# Patient Record
Sex: Female | Born: 2000
Health system: Southern US, Community
[De-identification: ages and names within clinical notes are randomized; demographics above are authoritative.]

## PROBLEM LIST (undated history)

## (undated) ENCOUNTER — Inpatient Hospital Stay (HOSPITAL_COMMUNITY): Payer: Self-pay

## (undated) DIAGNOSIS — J45909 Unspecified asthma, uncomplicated: Secondary | ICD-10-CM

## (undated) HISTORY — PX: TONSILLECTOMY: SUR1361

## (undated) HISTORY — PX: WISDOM TOOTH EXTRACTION: SHX21

## (undated) HISTORY — DX: Unspecified asthma, uncomplicated: J45.909

## (undated) HISTORY — PX: OOPHORECTOMY: SHX86

## (undated) NOTE — Telephone Encounter (Signed)
 Formatting of this note might be different from the original. Message forwarded Electronically signed by Leotis Hendricks Conrad, Patient Care Technician at 06/10/2019  7:44 AM EST

## (undated) NOTE — Telephone Encounter (Signed)
 Formatting of this note might be different from the original. See message Electronically signed by Rosina Earnie Birmingham, LPN at 96/98/7978 10:12 AM EST

## (undated) NOTE — Telephone Encounter (Signed)
 Formatting of this note might be different from the original. Message forwarded Electronically signed by Leotis Hendricks Conrad, Patient Care Technician at 06/10/2019  9:17 AM EST

---
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
## 2018-01-14 ENCOUNTER — Emergency Department (HOSPITAL_COMMUNITY): Payer: Medicaid Other

## 2018-01-14 ENCOUNTER — Encounter (HOSPITAL_COMMUNITY): Payer: Self-pay | Admitting: *Deleted

## 2018-01-14 ENCOUNTER — Other Ambulatory Visit: Payer: Self-pay

## 2018-01-14 ENCOUNTER — Emergency Department (HOSPITAL_COMMUNITY)
Admission: EM | Admit: 2018-01-14 | Discharge: 2018-01-14 | Disposition: A | Discharge status: Home / Self Care | Payer: Medicaid Other | Attending: Emergency Medicine | Admitting: Emergency Medicine

## 2018-01-14 DIAGNOSIS — S60222A Contusion of left hand, initial encounter: Secondary | ICD-10-CM | POA: Diagnosis not present

## 2018-01-14 DIAGNOSIS — S8012XA Contusion of left lower leg, initial encounter: Secondary | ICD-10-CM | POA: Diagnosis not present

## 2018-01-14 DIAGNOSIS — S0081XA Abrasion of other part of head, initial encounter: Secondary | ICD-10-CM | POA: Diagnosis not present

## 2018-01-14 DIAGNOSIS — Y939 Activity, unspecified: Secondary | ICD-10-CM | POA: Diagnosis not present

## 2018-01-14 DIAGNOSIS — Z3491 Encounter for supervision of normal pregnancy, unspecified, first trimester: Secondary | ICD-10-CM | POA: Diagnosis not present

## 2018-01-14 DIAGNOSIS — Z3A01 Less than 8 weeks gestation of pregnancy: Secondary | ICD-10-CM | POA: Diagnosis not present

## 2018-01-14 DIAGNOSIS — O418X10 Other specified disorders of amniotic fluid and membranes, first trimester, not applicable or unspecified: Secondary | ICD-10-CM

## 2018-01-14 DIAGNOSIS — O468X1 Other antepartum hemorrhage, first trimester: Secondary | ICD-10-CM | POA: Diagnosis not present

## 2018-01-14 DIAGNOSIS — S0990XA Unspecified injury of head, initial encounter: Secondary | ICD-10-CM | POA: Diagnosis present

## 2018-01-14 DIAGNOSIS — S0083XA Contusion of other part of head, initial encounter: Secondary | ICD-10-CM | POA: Diagnosis not present

## 2018-01-14 DIAGNOSIS — Y929 Unspecified place or not applicable: Secondary | ICD-10-CM | POA: Diagnosis not present

## 2018-01-14 DIAGNOSIS — R102 Pelvic and perineal pain: Secondary | ICD-10-CM

## 2018-01-14 DIAGNOSIS — Y999 Unspecified external cause status: Secondary | ICD-10-CM | POA: Insufficient documentation

## 2018-01-14 LAB — ABO/RH: ABO/RH(D): A POS

## 2018-01-14 LAB — HCG, QUANTITATIVE, PREGNANCY: HCG, BETA CHAIN, QUANT, S: 82030 m[IU]/mL — AB (ref <5)

## 2018-01-14 NOTE — Discharge Instructions (Signed)
Return if you are having any problem - especially if you develop vaginal bleeding.  Apply ice as needed.  Take acetaminophen for pain. Do not take ibuprofen or naproxen.
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
## 2018-01-14 NOTE — ED Notes (Signed)
Patient given discharge instructions and verbalized understanding.  Patient stable to discharge at this time.  Patient is alert and oriented to baseline.  No distressed noted at this time.  All belongings taken with the patient at discharge.   

## 2018-01-15 ENCOUNTER — Telehealth: Payer: Self-pay | Admitting: *Deleted
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
## 2018-01-25 ENCOUNTER — Telehealth: Payer: Self-pay | Admitting: Family Medicine

## 2018-01-25 DIAGNOSIS — O219 Vomiting of pregnancy, unspecified: Secondary | ICD-10-CM

## 2018-01-25 MED ORDER — PROMETHAZINE HCL 25 MG PO TABS: 25.0000 mg | ORAL_TABLET | Freq: Four times a day (QID) | ORAL | 1 refills | Status: DC | PRN

## 2018-01-25 NOTE — Telephone Encounter (Signed)
Pt called stated that she needs a prescription for nausea. Send to the [facility] on [address].

## 2018-01-25 NOTE — Telephone Encounter (Signed)
Pt called stated that she needs a prescription for nausea. Send to the [facility] on [address].  

## 2018-01-25 NOTE — Addendum Note (Signed)
Addended by: Gerome Apley on: 01/25/2018 04:34 PM   Modules accepted: Orders

## 2018-02-06 ENCOUNTER — Other Ambulatory Visit (HOSPITAL_COMMUNITY)
Admission: RE | Admit: 2018-02-06 | Discharge: 2018-02-06 | Disposition: A | Discharge status: Home / Self Care | Payer: Medicaid Other | Source: Ambulatory Visit | Attending: Student | Admitting: Student

## 2018-02-06 ENCOUNTER — Encounter: Payer: Self-pay | Admitting: Student

## 2018-02-06 ENCOUNTER — Ambulatory Visit (INDEPENDENT_AMBULATORY_CARE_PROVIDER_SITE_OTHER): Payer: Medicaid Other | Admitting: Student

## 2018-02-06 DIAGNOSIS — Z3480 Encounter for supervision of other normal pregnancy, unspecified trimester: Secondary | ICD-10-CM

## 2018-02-06 DIAGNOSIS — Z3481 Encounter for supervision of other normal pregnancy, first trimester: Secondary | ICD-10-CM | POA: Insufficient documentation

## 2018-02-06 DIAGNOSIS — Z23 Encounter for immunization: Secondary | ICD-10-CM | POA: Diagnosis not present

## 2018-02-06 DIAGNOSIS — Z3A10 10 weeks gestation of pregnancy: Secondary | ICD-10-CM | POA: Diagnosis not present

## 2018-02-06 LAB — POCT URINALYSIS DIP (DEVICE)
BILIRUBIN URINE: NEGATIVE
Glucose, UA: NEGATIVE mg/dL
HGB URINE DIPSTICK: NEGATIVE
LEUKOCYTES UA: NEGATIVE
Nitrite: NEGATIVE
Protein, ur: 30 mg/dL — AB
Urobilinogen, UA: 0.2 mg/dL (ref 0.0–1.0)
pH: 6.5 (ref 5.0–8.0)

## 2018-02-06 NOTE — Progress Notes (Signed)
  Subjective:    Christy Hooper is being seen today for her first obstetrical visit.  This is not a planned pregnancy. She is at [redacted]w[redacted]d gestation. Her obstetrical history is significant for . Relationship with FOB: not together. . Patient does intend to breast feed. Pregnancy history fully reviewed.  Patient reports no complaints other than her breasts are enlarged and starting to hurt.   Review of Systems:   Review of Systems  Constitutional: Negative.   HENT: Negative.   Respiratory: Negative.   Gastrointestinal: Negative.   Genitourinary: Negative.   Musculoskeletal: Negative.   Neurological: Negative.     Objective:     BP 116/71   Pulse 89   Wt 132 lb 12.8 oz (60.2 kg)   LMP 11/24/2017 (Approximate) Comment: pt shielded  BMI 24.29 kg/m  Physical Exam  Constitutional: She appears well-developed.  HENT:  Head: Normocephalic.  Neck: Normal range of motion.  Respiratory: Effort normal.  GI: Soft.  Musculoskeletal: Normal range of motion.  Neurological: She is alert.  Skin: Skin is warm and dry.    Exam    Assessment:    Pregnancy: G1P0 Patient Active Problem List   Diagnosis Date Noted  . Supervision of other normal pregnancy, antepartum 02/06/2018       Plan:     Initial labs drawn. Prenatal vitamins. Problem list reviewed and updated. AFP3 discussed: will do NIPS; needs AFP. Role of ultrasound in pregnancy discussed; fetal survey: ordered. Amniocentesis discussed: not indicated. Follow up in 4 weeks. 50% of 30 min visit spent on counseling and coordination of care.  -Discussed breastfeeding -Discussed contraception; does not want nexplanon or IUD -Welcomed patient to practice and oriented her to providers, students, residents.   Charlesetta Garibaldi Kooistra 02/06/2018

## 2018-02-06 NOTE — Patient Instructions (Signed)
Eating Plan for Pregnant Women While you are pregnant, your body will require additional nutrition to help support your growing baby. It is recommended that you consume:  150 additional calories each day during your first trimester.  300 additional calories each day during your second trimester.  300 additional calories each day during your third trimester.  Eating a healthy, well-balanced diet is very important for your health and for your baby's health. You also have a higher need for some vitamins and minerals, such as folic acid, calcium, iron, and vitamin D. What do I need to know about eating during pregnancy?  Do not try to lose weight or go on a diet during pregnancy.  Choose healthy, nutritious foods. Choose  of a sandwich with a glass of milk instead of a candy bar or a high-calorie sugar-sweetened beverage.  Limit your overall intake of foods that have "empty calories." These are foods that have little nutritional value, such as sweets, desserts, candies, sugar-sweetened beverages, and fried foods.  Eat a variety of foods, especially fruits and vegetables.  Take a prenatal vitamin to help meet the additional needs during pregnancy, specifically for folic acid, iron, calcium, and vitamin D.  Remember to stay active. Ask your health care provider for exercise recommendations that are specific to you.  Practice good food safety and cleanliness, such as washing your hands before you eat and after you prepare raw meat. This helps to prevent foodborne illnesses, such as listeriosis, that can be very dangerous for your baby. Ask your health care provider for more information about listeriosis. What does 150 extra calories look like? Healthy options for an additional 150 calories each day could be any of the following:  Plain low-fat yogurt (6-8 oz) with  cup of berries.  1 apple with 2 teaspoons of peanut butter.  Cut-up vegetables with  cup of hummus.  Low-fat chocolate  milk (8 oz or 1 cup).  1 string cheese with 1 medium orange.   of a peanut butter and jelly sandwich on whole-wheat bread (1 tsp of peanut butter).  For 300 calories, you could eat two of those healthy options each day. What is a healthy amount of weight to gain? The recommended amount of weight for you to gain is based on your pre-pregnancy BMI. If your pre-pregnancy BMI was:  Less than 18 (underweight), you should gain 28-40 lb.  18-24.9 (normal), you should gain 25-35 lb.  25-29.9 (overweight), you should gain 15-25 lb.  Greater than 30 (obese), you should gain 11-20 lb.  What if I am having twins or multiples? Generally, pregnant women who will be having twins or multiples may need to increase their daily calories by 300-600 calories each day. The recommended range for total weight gain is 25-54 lb, depending on your pre-pregnancy BMI. Talk with your health care provider for specific guidance about additional nutritional needs, weight gain, and exercise during your pregnancy. What foods can I eat? Grains Any grains. Try to choose whole grains, such as whole-wheat bread, oatmeal, or brown rice. Vegetables Any vegetables. Try to eat a variety of colors and types of vegetables to get a full range of vitamins and minerals. Remember to wash your vegetables well before eating. Fruits Any fruits. Try to eat a variety of colors and types of fruit to get a full range of vitamins and minerals. Remember to wash your fruits well before eating. Meats and Other Protein Sources Lean meats, including chicken, turkey, fish, and lean cuts of beef, veal,   or pork. Make sure that all meats are cooked to "well done." Tofu. Tempeh. Beans. Eggs. Peanut butter and other nut butters. Seafood, such as shrimp, crab, and lobster. If you choose fish, select types that are higher in omega-3 fatty acids, including salmon, herring, mussels, trout, sardines, and pollock. Make sure that all meats are cooked to  food-safe temperatures. Dairy Pasteurized milk and milk alternatives. Pasteurized yogurt and pasteurized cheese. Cottage cheese. Sour cream. Beverages Water. Juices that contain 100% fruit juice or vegetable juice. Caffeine-free teas and decaffeinated coffee. Drinks that contain caffeine are okay to drink, but it is better to avoid caffeine. Keep your total caffeine intake to less than 200 mg each day (12 oz of coffee, tea, or soda) or as directed by your health care provider. Condiments Any pasteurized condiments. Sweets and Desserts Any sweets and desserts. Fats and Oils Any fats and oils. The items listed above may not be a complete list of recommended foods or beverages. Contact your dietitian for more options. What foods are not recommended? Vegetables Unpasteurized (raw) vegetable juices. Fruits Unpasteurized (raw) fruit juices. Meats and Other Protein Sources Cured meats that have nitrates, such as bacon, salami, and hotdogs. Luncheon meats, bologna, or other deli meats (unless they are reheated until they are steaming hot). Refrigerated pate, meat spreads from a meat counter, smoked seafood that is found in the refrigerated section of a store. Raw fish, such as sushi or sashimi. High mercury content fish, such as tilefish, shark, swordfish, and king mackerel. Raw meats, such as tuna or beef tartare. Undercooked meats and poultry. Make sure that all meats are cooked to food-safe temperatures. Dairy Unpasteurized (raw) milk and any foods that have raw milk in them. Soft cheeses, such as feta, queso blanco, queso fresco, Brie, Camembert cheeses, blue-veined cheeses, and Panela cheese (unless it is made with pasteurized milk, which must be stated on the label). Beverages Alcohol. Sugar-sweetened beverages, such as sodas, teas, or energy drinks. Condiments Homemade fermented foods and drinks, such as pickles, sauerkraut, or kombucha drinks. (Store-bought pasteurized versions of these are  okay.) Other Salads that are made in the store, such as ham salad, chicken salad, egg salad, tuna salad, and seafood salad. The items listed above may not be a complete list of foods and beverages to avoid. Contact your dietitian for more information. This information is not intended to replace advice given to you by your health care provider. Make sure you discuss any questions you have with your health care provider. Document Released: 01/10/2014 Document Revised: 09/03/2015 Document Reviewed: 09/10/2013 Elsevier Interactive Patient Education  2018 Elsevier Inc.   

## 2018-02-06 NOTE — Progress Notes (Signed)
New OB packets not available/  Medicaid Home Form Completed 

## 2018-02-07 LAB — CERVICOVAGINAL ANCILLARY ONLY
CHLAMYDIA, DNA PROBE: NEGATIVE
Neisseria Gonorrhea: NEGATIVE

## 2018-02-09 LAB — URINE CULTURE, OB REFLEX

## 2018-02-09 LAB — CULTURE, OB URINE

## 2018-02-13 ENCOUNTER — Telehealth: Payer: Self-pay | Admitting: General Practice

## 2018-02-13 NOTE — Telephone Encounter (Signed)
Patient called and left message on nurse voicemail line requesting genetic test results. Called patient & explained results are not back yet but will show up in mychart whenever we get them back which should be another week or so. Patient verbalized understanding & had no questions.

## 2018-02-14 LAB — OBSTETRIC PANEL, INCLUDING HIV
Antibody Screen: NEGATIVE
BASOS ABS: 0 10*3/uL (ref 0.0–0.2)
Basos: 0 %
EOS (ABSOLUTE): 0 10*3/uL (ref 0.0–0.4)
Eos: 0 %
HEP B S AG: NEGATIVE
HIV SCREEN 4TH GENERATION: NONREACTIVE
Hematocrit: 33.8 % — ABNORMAL LOW (ref 34.0–46.6)
Hemoglobin: 11.4 g/dL (ref 11.1–15.9)
Immature Grans (Abs): 0 10*3/uL (ref 0.0–0.1)
Immature Granulocytes: 0 %
LYMPHS ABS: 1.2 10*3/uL (ref 0.7–3.1)
Lymphs: 11 %
MCH: 29.5 pg (ref 26.6–33.0)
MCHC: 33.7 g/dL (ref 31.5–35.7)
MCV: 88 fL (ref 79–97)
MONOS ABS: 1 10*3/uL — AB (ref 0.1–0.9)
Monocytes: 9 %
NEUTROS ABS: 8.7 10*3/uL — AB (ref 1.4–7.0)
Neutrophils: 80 %
PLATELETS: 271 10*3/uL (ref 150–450)
RBC: 3.86 x10E6/uL (ref 3.77–5.28)
RDW: 14.6 % (ref 12.3–15.4)
RH TYPE: POSITIVE
RPR: NONREACTIVE
Rubella Antibodies, IGG: 6.6 index (ref >0.99)
WBC: 11 10*3/uL — AB (ref 3.4–10.8)

## 2018-02-14 LAB — SMN1 COPY NUMBER ANALYSIS (SMA CARRIER SCREENING)

## 2018-02-14 LAB — HEMOGLOBINOPATHY EVALUATION
Ferritin: 24 ng/mL (ref 15–77)
HGB A2 QUANT: 2.3 % (ref 1.8–3.2)
HGB A: 97.7 % (ref 96.4–98.8)
HGB C: 0 %
HGB F QUANT: 0 % (ref 0.0–2.0)
HGB S: 0 %
Hgb Solubility: NEGATIVE
Hgb Variant: 0 %

## 2018-02-14 LAB — HEMOGLOBIN A1C
Est. average glucose Bld gHb Est-mCnc: 111 mg/dL
Hgb A1c MFr Bld: 5.5 % (ref 4.8–5.6)

## 2018-02-14 LAB — CYSTIC FIBROSIS GENE TEST

## 2018-02-16 ENCOUNTER — Inpatient Hospital Stay (HOSPITAL_COMMUNITY)
Admission: AD | Admit: 2018-02-16 | Discharge: 2018-02-17 | Disposition: A | Discharge status: Home / Self Care | Payer: Medicaid Other | Source: Ambulatory Visit | Attending: Obstetrics and Gynecology | Admitting: Obstetrics and Gynecology

## 2018-02-16 ENCOUNTER — Encounter (HOSPITAL_COMMUNITY): Payer: Self-pay

## 2018-02-16 DIAGNOSIS — O210 Mild hyperemesis gravidarum: Secondary | ICD-10-CM | POA: Insufficient documentation

## 2018-02-16 DIAGNOSIS — Z3480 Encounter for supervision of other normal pregnancy, unspecified trimester: Secondary | ICD-10-CM

## 2018-02-16 DIAGNOSIS — Z87891 Personal history of nicotine dependence: Secondary | ICD-10-CM | POA: Diagnosis not present

## 2018-02-16 DIAGNOSIS — E860 Dehydration: Secondary | ICD-10-CM | POA: Diagnosis not present

## 2018-02-16 DIAGNOSIS — O219 Vomiting of pregnancy, unspecified: Secondary | ICD-10-CM | POA: Diagnosis not present

## 2018-02-16 DIAGNOSIS — Z3A12 12 weeks gestation of pregnancy: Secondary | ICD-10-CM | POA: Diagnosis not present

## 2018-02-16 MED ORDER — SODIUM CHLORIDE 0.9 % IV SOLN
INTRAVENOUS | Status: DC
  Administered 2018-02-16: 23:00:00 via INTRAVENOUS

## 2018-02-16 MED ORDER — ONDANSETRON 4 MG PO TBDP: 4.0000 mg | ORAL_TABLET | Freq: Once | ORAL | Status: DC

## 2018-02-16 MED ORDER — ONDANSETRON HCL 4 MG/2ML IJ SOLN
4.0000 mg | Freq: Once | INTRAMUSCULAR | Status: AC
  Administered 2018-02-16: 4 mg via INTRAVENOUS
  Filled 2018-02-16: qty 2

## 2018-02-16 NOTE — [facility] Provider Note (Signed)
Chief Complaint: Abdominal Pain and Emesis   First Provider Initiated Contact with Patient 02/16/18 2301        SUBJECTIVE HPI: Christy Hooper is a 17 y.o. G1P0 at [redacted]w[redacted]d by LMP who presents to maternity admissions reporting recurrent and frequent vomiting.  States phenergan is not helping.  . She denies vaginal bleeding, vaginal itching/burning, urinary symptoms, h/a, dizziness, or fever/chills.     Emesis   This is a recurrent problem. The emesis has an appearance of stomach contents. There has been no fever. Pertinent negatives include no abdominal pain (only muscles when vomiting), chills, diarrhea, dizziness, fever, headaches or myalgias. Treatments tried: Phenergan. The treatment provided no relief.   RN Note; Vomiting all day. Phenergan not working. No diarrhea. No vag bleeding or d/c. Hurts when I throw up but otherwise no pain  Past Medical History:  Diagnosis Date  . Asthma    Past Surgical History:  Procedure Laterality Date  . TONSILLECTOMY    . WISDOM TOOTH EXTRACTION     Social History   Socioeconomic History  . Marital status: Single    Spouse name: Not on file  . Number of children: Not on file  . Years of education: Not on file  . Highest education level: Not on file  Occupational History  . Not on file  Social Needs  . Financial resource strain: Not on file  . Food insecurity:    Worry: Not on file    Inability: Not on file  . Transportation needs:    Medical: Not on file    Non-medical: Not on file  Tobacco Use  . Smoking status: Former Smoker    Types: Cigarettes, E-cigarettes  . Smokeless tobacco: Never Used  Substance and Sexual Activity  . Alcohol use: Yes  . Drug use: Yes    Types: Marijuana  . Sexual activity: Yes    Birth control/protection: None  Lifestyle  . Physical activity:    Days per week: Not on file    Minutes per session: Not on file  . Stress: Not on file  Relationships  . Social connections:    Talks on phone: Not on file     Gets together: Not on file    Attends religious service: Not on file    Active member of club or organization: Not on file    Attends meetings of clubs or organizations: Not on file    Relationship status: Not on file  . Intimate partner violence:    Fear of current or ex partner: Not on file    Emotionally abused: Not on file    Physically abused: Not on file    Forced sexual activity: Not on file  Other Topics Concern  . Not on file  Social History Narrative  . Not on file   No current facility-administered medications on file prior to encounter.    Current Outpatient Medications on File Prior to Encounter  Medication Sig Dispense Refill  . Prenatal Vit-Fe Fumarate-FA (PRENATAL COMPLETE) 14-0.4 MG TABS Take 1 tablet by mouth daily. 60 each 0  . promethazine (PHENERGAN) 25 MG tablet Take 1 tablet (25 mg total) by mouth every 6 (six) hours as needed for nausea or vomiting. 30 tablet 1   No Known Allergies  I have reviewed patient's Past Medical Hx, Surgical Hx, Family Hx, Social Hx, medications and allergies.   ROS:  Review of Systems  Constitutional: Negative for chills and fever.  Gastrointestinal: Positive for vomiting. Negative for abdominal pain (  only muscles when vomiting) and diarrhea.  Musculoskeletal: Negative for myalgias.  Neurological: Negative for dizziness and headaches.   Review of Systems  Other systems negative   Physical Exam  Physical Exam Patient Vitals for the past 24 hrs:  BP Temp Pulse Resp Height Weight  02/16/18 2158 111/66 98.1 F (36.7 C) (!) 110 18 5\' 3"  (1.6 m) 59.4 kg  Orthostatic VS: Lying  113/65   88 Sitting 114/68   89 Stand  114/71  108  Constitutional: Well-developed, well-nourished female in no acute distress.  Cardiovascular: normal rate Respiratory: normal effort GI: Abd soft, non-tender. Pos BS x 4 MS: Extremities nontender, no edema, normal ROM Neurologic: Alert and oriented x 4.  GU: Neg CVAT.  PELVIC EXAM:  deferred  FHT 160 by doppler  LAB RESULTS Results for orders placed or performed during the hospital encounter of 02/16/18 (from the past 24 hour(s))  Urinalysis, Routine w reflex microscopic     Status: Abnormal   Collection Time: 02/16/18 10:06 PM  Result Value Ref Range   Color, Urine YELLOW YELLOW   APPearance HAZY (A) CLEAR   Specific Gravity, Urine 1.020 1.005 - 1.030   pH 5.0 5.0 - 8.0   Glucose, UA NEGATIVE NEGATIVE mg/dL   Hgb urine dipstick NEGATIVE NEGATIVE   Bilirubin Urine NEGATIVE NEGATIVE   Ketones, ur 80 (A) NEGATIVE mg/dL   Protein, ur NEGATIVE NEGATIVE mg/dL   Nitrite NEGATIVE NEGATIVE   Leukocytes, UA LARGE (A) NEGATIVE   RBC / HPF 0-5 0 - 5 RBC/hpf   WBC, UA 11-20 0 - 5 WBC/hpf   Bacteria, UA RARE (A) NONE SEEN   Squamous Epithelial / LPF 0-5 0 - 5   Mucus PRESENT      A/Positive/-- (10/29 1501)  IMAGING No results found.  [facility] Management/MDM: Orthostatic VS remarkable for increased heart rate IV started and 2 liters given Zofran given with good relief Repeated Orthostatic VS with improvement noted  ASSESSMENT Single intrauterine pregnancy at [redacted]w[redacted]d Nausea and vomiting, persistent Orthostatic changes, improved after hydration  PLAN Discharge home Rx zofran to use when Phenergan does not work Advance diet as tolerated, slow steady intake  Pt stable at time of discharge. Encouraged to return here or to other Urgent Care/ED if she develops worsening of symptoms, increase in pain, fever, or other concerning symptoms.    Wynelle Bourgeois CNM, MSN Certified Nurse-Midwife 02/16/2018  11:01 PM

## 2018-02-16 NOTE — [facility] Note (Addendum)
Vomiting all day. Phenergan not working. No diarrhea. No vag bleeding or d/c. Hurts when I throw up but otherwise no pain

## 2018-02-17 LAB — URINALYSIS, ROUTINE W REFLEX MICROSCOPIC
BILIRUBIN URINE: NEGATIVE
GLUCOSE, UA: NEGATIVE mg/dL
Hgb urine dipstick: NEGATIVE
KETONES UR: 80 mg/dL — AB
Nitrite: NEGATIVE
Protein, ur: NEGATIVE mg/dL
Specific Gravity, Urine: 1.02 (ref 1.005–1.030)
pH: 5 (ref 5.0–8.0)

## 2018-02-17 MED ORDER — ONDANSETRON 4 MG PO TBDP: 4.0000 mg | ORAL_TABLET | Freq: Four times a day (QID) | ORAL | 0 refills | Status: DC | PRN

## 2018-02-17 NOTE — Discharge Instructions (Signed)
Eating Plan for Hyperemesis Gravidarum °Hyperemesis gravidarum is a severe form of morning sickness. Because this condition causes severe nausea and vomiting, it can lead to dehydration, malnutrition, and weight loss. One way to lessen the symptoms of nausea and vomiting is to follow the eating plan for hyperemesis gravidarum. It is often used along with prescribed medicines to control your symptoms. °What can I do to relieve my symptoms? °Listen to your body. Everyone is different and has different preferences. Find what works best for you. Take any of the following actions that are helpful to you: °· Eat and drink slowly. °· Eat 5-6 small meals daily instead of 3 large meals. °· Eat crackers before you get out of bed in the morning. °· Try having a snack in the middle of the night. °· Starchy foods are usually tolerated well. Examples include cereal, toast, bread, potatoes, pasta, rice, and pretzels. °· Ginger may help with nausea. Add ¼ tsp ground ginger to hot tea or choose ginger tea. °· Try drinking 100% fruit juice or an electrolyte drink. An electrolyte drink contains sodium, potassium, and chloride. °· Continue to take your prenatal vitamins as told by your health care provider. If you are having trouble taking your prenatal vitamins, talk with your health care provider about different options. °· Include at least 1 serving of protein with your meals and snacks. Protein options include meats or poultry, beans, nuts, eggs, and yogurt. Try eating a protein-rich snack before bed. Examples of these snacks include cheese and crackers or half of a peanut butter or turkey sandwich. °· Consider eliminating foods that trigger your symptoms. These may include spicy foods, coffee, high-fat foods, very sweet foods, and acidic foods. °· Try meals that have more protein combined with bland, salty, lower-fat, and dry foods, such as nuts, seeds, pretzels, crackers, and cereal. °· Talk with your healthcare provider about  starting a supplement of vitamin B6. °· Have fluids that are cold, clear, and carbonated or sour. Examples include lemonade, ginger ale, lemon-lime soda, ice water, and sparkling water. °· Try lemon or mint tea. °· Try brushing your teeth or using a mouth rinse after meals. ° °What should I avoid to reduce my symptoms? °Avoiding some of the following things may help reduce your symptoms. °· Foods with strong smells. Try eating meals in well-ventilated areas that are free of odors. °· Drinking water or other beverages with meals. Try not to drink anything during the 30 minutes before and after your meals. °· Drinking more than 1 cup of fluid at a time. Sometimes using a straw helps. °· Fried or high-fat foods, such as butter and cream sauces. °· Spicy foods. °· Skipping meals as best as you can. Nausea can be more intense on an empty stomach. If you cannot tolerate food at that time, do not force it. Try sucking on ice chips or other frozen items, and make up for missed calories later. °· Lying down within 2 hours after eating. °· Environmental triggers. These may include smoky rooms, closed spaces, rooms with strong smells, warm or humid places, overly loud and noisy rooms, and rooms with motion or flickering lights. °· Quick and sudden changes in your movement. ° °This information is not intended to replace advice given to you by your health care provider. Make sure you discuss any questions you have with your health care provider. °Document Released: 01/23/2007 Document Revised: 11/25/2015 Document Reviewed: 10/27/2015 °Elsevier Interactive Patient Education © 2018 Elsevier Inc. °Morning Sickness °Morning sickness   is when you feel sick to your stomach (nauseous) during pregnancy. This nauseous feeling may or may not come with vomiting. It often occurs in the morning but can be a problem any time of day. Morning sickness is most common during the first trimester, but it may continue throughout pregnancy. While  morning sickness is unpleasant, it is usually harmless unless you develop severe and continual vomiting (hyperemesis gravidarum). This condition requires more intense treatment. °What are the causes? °The cause of morning sickness is not completely known but seems to be related to normal hormonal changes that occur in pregnancy. °What increases the risk? °You are at greater risk if you: °· Experienced nausea or vomiting before your pregnancy. °· Had morning sickness during a previous pregnancy. °· Are pregnant with more than one baby, such as twins. ° °How is this treated? °Do not use any medicines (prescription, over-the-counter, or herbal) for morning sickness without first talking to your health care provider. Your health care provider may prescribe or recommend: °· Vitamin B6 supplements. °· Anti-nausea medicines. °· The herbal medicine ginger. ° °Follow these instructions at home: °· Only take over-the-counter or prescription medicines as directed by your health care provider. °· Taking multivitamins before getting pregnant can prevent or decrease the severity of morning sickness in most women. °· Eat a piece of dry toast or unsalted crackers before getting out of bed in the morning. °· Eat five or six small meals a day. °· Eat dry and bland foods (rice, baked potato). Foods high in carbohydrates are often helpful. °· Do not drink liquids with your meals. Drink liquids between meals. °· Avoid greasy, fatty, and spicy foods. °· Get someone to cook for you if the smell of any food causes nausea and vomiting. °· If you feel nauseous after taking prenatal vitamins, take the vitamins at night or with a snack. °· Snack on protein foods (nuts, yogurt, cheese) between meals if you are hungry. °· Eat unsweetened gelatins for desserts. °· Wearing an acupressure wristband (worn for sea sickness) may be helpful. °· Acupuncture may be helpful. °· Do not smoke. °· Get a humidifier to keep the air in your house free of  odors. °· Get plenty of fresh air. °Contact a health care provider if: °· Your home remedies are not working, and you need medicine. °· You feel dizzy or lightheaded. °· You are losing weight. °Get help right away if: °· You have persistent and uncontrolled nausea and vomiting. °· You pass out (faint). °This information is not intended to replace advice given to you by your health care provider. Make sure you discuss any questions you have with your health care provider. °Document Released: 05/19/2006 Document Revised: 09/03/2015 Document Reviewed: 09/12/2012 °Elsevier Interactive Patient Education © 2017 Else[employer] Inc. ° °

## 2018-02-19 ENCOUNTER — Ambulatory Visit (INDEPENDENT_AMBULATORY_CARE_PROVIDER_SITE_OTHER): Payer: Medicaid Other | Admitting: *Deleted

## 2018-02-19 ENCOUNTER — Other Ambulatory Visit (HOSPITAL_COMMUNITY)
Admission: RE | Admit: 2018-02-19 | Discharge: 2018-02-19 | Disposition: A | Discharge status: Home / Self Care | Payer: Medicaid Other | Source: Ambulatory Visit | Attending: Family Medicine | Admitting: Family Medicine

## 2018-02-19 DIAGNOSIS — N898 Other specified noninflammatory disorders of vagina: Secondary | ICD-10-CM | POA: Insufficient documentation

## 2018-02-19 NOTE — Progress Notes (Signed)
Having yellow vag d/c that started today. Denies vag itching or burning. Was seen [facility] over wkend with n/v but doing much better now. Pt knows results will appear on her Mychart in few days. Agrees with POC

## 2018-02-20 ENCOUNTER — Telehealth: Payer: Self-pay | Admitting: General Practice

## 2018-02-20 LAB — CERVICOVAGINAL ANCILLARY ONLY
BACTERIAL VAGINITIS: NEGATIVE
CANDIDA VAGINITIS: POSITIVE — AB

## 2018-02-20 NOTE — Telephone Encounter (Signed)
Called patient regarding mychart message and discussed results were not back yet and would likely be back sometime tomorrow. Discussed we will reach out with any abnormal results. Patient verbalized understanding and had no questions.

## 2018-02-20 NOTE — Progress Notes (Signed)
Agree with RN documentation and plan of care.  Sherol Sabas S. Alazar Cherian, DO OB/GYN Fellow  

## 2018-02-22 ENCOUNTER — Other Ambulatory Visit: Payer: Self-pay | Admitting: General Practice

## 2018-02-22 ENCOUNTER — Encounter: Payer: Self-pay | Admitting: General Practice

## 2018-02-22 DIAGNOSIS — B379 Candidiasis, unspecified: Secondary | ICD-10-CM

## 2018-02-22 MED ORDER — TERCONAZOLE 0.4 % VA CREA: 1.0000 | TOPICAL_CREAM | Freq: Every day | VAGINAL | 0 refills | Status: DC

## 2018-03-06 ENCOUNTER — Ambulatory Visit (INDEPENDENT_AMBULATORY_CARE_PROVIDER_SITE_OTHER): Payer: Medicaid Other | Admitting: Student

## 2018-03-06 DIAGNOSIS — Z3A14 14 weeks gestation of pregnancy: Secondary | ICD-10-CM

## 2018-03-06 DIAGNOSIS — J45909 Unspecified asthma, uncomplicated: Secondary | ICD-10-CM

## 2018-03-06 DIAGNOSIS — O99512 Diseases of the respiratory system complicating pregnancy, second trimester: Secondary | ICD-10-CM

## 2018-03-06 DIAGNOSIS — Z3480 Encounter for supervision of other normal pregnancy, unspecified trimester: Secondary | ICD-10-CM

## 2018-03-06 NOTE — Progress Notes (Signed)
Patient ID: Christy Hooper, female   DOB: 08/11/2000, 17 y.o.   MRN: [id number]   PRENATAL VISIT NOTE  Subjective:  Christy Hooper is a 17 y.o. G1P0 at 5723w4d being seen today for ongoing prenatal care.  She is currently monitored for the following issues for this low-risk pregnancy and has Supervision of other normal pregnancy, antepartum and Mild asthma on their problem list.  Patient reports no complaints. She feels like her nausea and vomiting is getting better. She says her food aversions are just to some foods.   Contractions: Not present. Vag. Bleeding: None.  Movement: Present. Denies leaking of fluid.   The following portions of the patient's history were reviewed and updated as appropriate: allergies, current medications, past family history, past medical history, past social history, past surgical history and problem list. Problem list updated.  Objective:   Vitals:   03/06/18 1417  BP: 112/71  Pulse: (!) 110  Weight: 130 lb 3.2 oz (59.1 kg)    Fetal Status: Fetal Heart Rate (bpm): 155   Movement: Present     General:  Alert, oriented and cooperative. Patient is in no acute distress.  Skin: Skin is warm and dry. No rash noted.   Cardiovascular: Normal heart rate noted  Respiratory: Normal respiratory effort, no problems with respiration noted  Abdomen: Soft, gravid, appropriate for gestational age.  Pain/Pressure: Absent     Pelvic: Cervical exam deferred        Extremities: Normal range of motion.  Edema: None  Mental Status: Normal mood and affect. Normal behavior. Normal judgment and thought content.   Assessment and Plan:  Pregnancy: G1P0 at 3023w4d  1. Supervision of other normal pregnancy, antepartum -Doing well - now considering IUD   2. Mild asthma, unspecified whether complicated, unspecified whether persistent -does not have to use an inhaler  Preterm labor symptoms and general obstetric precautions including but not limited to vaginal bleeding, contractions,  leaking of fluid and fetal movement were reviewed in detail with the patient. Please refer to After Visit Summary for other counseling recommendations.  No follow-ups on file.  Future Appointments  Date Time Provider Department Center  04/06/2018  8:35 AM [facility] HornLawrence, Erin[facility]  04/06/2018 10:45 AM [facility] US 2 [facility]    Marylene LandKathryn Lorraine Taeden Geller, CNM

## 2018-03-06 NOTE — Patient Instructions (Signed)

## 2018-03-06 NOTE — Progress Notes (Signed)
Anatomy ultrasound scheduled for 04/06/18 @ 1045

## 2018-03-12 ENCOUNTER — Telehealth: Payer: Self-pay

## 2018-03-12 ENCOUNTER — Encounter (HOSPITAL_COMMUNITY): Payer: Self-pay

## 2018-03-12 ENCOUNTER — Other Ambulatory Visit: Payer: Self-pay

## 2018-03-12 DIAGNOSIS — B3731 Acute candidiasis of vulva and vagina: Secondary | ICD-10-CM

## 2018-03-12 DIAGNOSIS — B373 Candidiasis of vulva and vagina: Secondary | ICD-10-CM

## 2018-03-12 MED ORDER — TERCONAZOLE 0.4 % VA CREA: 1.0000 | TOPICAL_CREAM | Freq: Every day | VAGINAL | 0 refills | Status: DC

## 2018-03-12 NOTE — Telephone Encounter (Signed)
Tried to call pt to make her aware that she had previously tested positive for a Yeast Infection. Only heard a busy signal, no VM. Sent message thru My Chart.

## 2018-03-28 ENCOUNTER — Encounter (HOSPITAL_COMMUNITY): Payer: Self-pay

## 2018-04-06 ENCOUNTER — Ambulatory Visit (HOSPITAL_COMMUNITY)
Admission: RE | Admit: 2018-04-06 | Discharge: 2018-04-06 | Disposition: A | Discharge status: Home / Self Care | Payer: BLUE CROSS/BLUE SHIELD | Source: Ambulatory Visit | Attending: Student | Admitting: Student

## 2018-04-06 ENCOUNTER — Ambulatory Visit (INDEPENDENT_AMBULATORY_CARE_PROVIDER_SITE_OTHER): Payer: Medicaid Other | Admitting: Student

## 2018-04-06 VITALS — BP 110/69 | HR 102 | Wt 133.9 lb

## 2018-04-06 DIAGNOSIS — Z3480 Encounter for supervision of other normal pregnancy, unspecified trimester: Secondary | ICD-10-CM

## 2018-04-06 DIAGNOSIS — Z3482 Encounter for supervision of other normal pregnancy, second trimester: Secondary | ICD-10-CM | POA: Diagnosis present

## 2018-04-06 DIAGNOSIS — Z3A19 19 weeks gestation of pregnancy: Secondary | ICD-10-CM | POA: Diagnosis not present

## 2018-04-06 DIAGNOSIS — Z363 Encounter for antenatal screening for malformations: Secondary | ICD-10-CM | POA: Diagnosis not present

## 2018-04-06 NOTE — Progress Notes (Signed)
Subjective: Christy Hooper is a G1P0 at 7719w0d [facility]  She does not have a history of any mental health concerns. She is not currently sexually active. She is currently using no method for birth control.  Patient states father of baby, and family as her support system.   BP 110/69   Pulse (!) 102   Wt 133 lb 14.4 oz (60.7 kg)   LMP 11/24/2017 (Approximate) Comment: pt shielded  BMI 23.72 kg/m   Birth Control History:  No prior history reported   MDM Patient counseled on all options for birth control today including LARC. Patient will consider all options and will continue family planning counseling.   Assessment:  17 y.o. female considering depo or PP IUD for birth control  Plan: Continued family counseling  Gwyndolyn SaxonFigueroa, Ryver Zadrozny, Alexander MtLCSW 04/06/2018 10:18 AM

## 2018-04-06 NOTE — Progress Notes (Signed)
   PRENATAL VISIT NOTE  Subjective:  Christy Hooper is a 17 y.o. G1P0 at 7557w0d being seen today for ongoing prenatal care.  Christy Hooper is currently monitored for the following issues for this low-risk pregnancy and has Supervision of other normal pregnancy, antepartum and Mild asthma on their problem list.  Christy Hooper reports no complaints.  Contractions: Not present. Vag. Bleeding: None.  Movement: Present. Denies leaking of fluid.  Doing well today. Nausea has improved.   The following portions of the Christy Hooper's history were reviewed and updated as appropriate: allergies, current medications, past family history, past medical history, past social history, past surgical history and problem list. Problem list updated.  Objective:   Vitals:   04/06/18 0903  BP: 110/69  Pulse: (!) 102  Weight: 133 lb 14.4 oz (60.7 kg)    Fetal Status: Fetal Heart Rate (bpm): 156   Movement: Present     Fundal height at umbilicus  General:  Alert, oriented and cooperative. Christy Hooper is in no acute distress.  Skin: Skin is warm and dry. No rash noted.   Cardiovascular: Normal heart rate noted  Respiratory: Normal respiratory effort, no problems with respiration noted  Abdomen: Soft, gravid, appropriate for gestational age.  Pain/Pressure: Absent     Pelvic: Cervical exam deferred        Extremities: Normal range of motion.  Edema: None  Mental Status: Normal mood and affect. Normal behavior. Normal judgment and thought content.   Assessment and Plan:  Pregnancy: G1P0 at 7057w0d  1. Supervision of other normal pregnancy, antepartum -doing well. Anatomy ultrasound scheduled for this morning - AFP, Serum, Open Spina Bifida  Preterm labor symptoms and general obstetric precautions including but not limited to vaginal bleeding, contractions, leaking of fluid and fetal movement were reviewed in detail with the Christy Hooper. Please refer to After Visit Summary for other counseling recommendations.  Return in about 4 weeks  (around 05/04/2018) for Routine OB.  Future Appointments  Date Time Provider Department Center  04/06/2018 10:45 AM [facility] US 2 [facility]    Judeth HornErin Lindbergh Winkles, NP

## 2018-04-06 NOTE — Patient Instructions (Signed)

## 2018-04-10 LAB — AFP, SERUM, OPEN SPINA BIFIDA
AFP MoM: 0.67
AFP Value: 39.5 ng/mL
GEST. AGE ON COLLECTION DATE: 19 wk
Maternal Age At EDD: 20.2 yr
OSBR RISK 1 IN: 10000
Test Results:: NEGATIVE
WEIGHT: 133 [lb_av]

## 2018-04-11 NOTE — L&D Delivery Note (Signed)
Delivery Note Pt progressed to complete at 0229 and pushed very well. At 2:43 AM a viable female was delivered via Vaginal, Spontaneous (Presentation: ROA).  APGAR: 8, 9; weight: 3410gm (7lb 8.3oz). Nuchal cord x 1 reduced prior to delivery. Infant dried and placed on pt's abd; cord clamped and cut by FOB after delay; hospital cord blood sample collected.  Placenta status: spont, intact.  Cord: 3 vessel  Anesthesia:  Epidural Episiotomy: None Lacerations: None Est. Blood Loss (mL): 219  Mom to postpartum.  Baby to Couplet care / Skin to Skin.  Christy Hooper CNM 09/01/2018, 3:08 AM  Please schedule this patient for Postpartum visit in: 4 weeks with the following provider: Any provider For C/S patients schedule nurse incision check in weeks 2 weeks: no Low risk pregnancy complicated by: none Delivery mode:  SVD Anticipated Birth Control:  IUD PP Procedures needed: none  Schedule Integrated BH visit: no

## 2018-04-21 ENCOUNTER — Inpatient Hospital Stay (HOSPITAL_COMMUNITY)
Admission: AD | Admit: 2018-04-21 | Discharge: 2018-04-21 | Disposition: A | Discharge status: Home / Self Care | Payer: BLUE CROSS/BLUE SHIELD | Attending: Family Medicine | Admitting: Family Medicine

## 2018-04-21 ENCOUNTER — Encounter (HOSPITAL_COMMUNITY): Payer: Self-pay

## 2018-04-21 ENCOUNTER — Other Ambulatory Visit: Payer: Self-pay

## 2018-04-21 ENCOUNTER — Inpatient Hospital Stay (HOSPITAL_BASED_OUTPATIENT_CLINIC_OR_DEPARTMENT_OTHER): Payer: BLUE CROSS/BLUE SHIELD

## 2018-04-21 DIAGNOSIS — O99332 Smoking (tobacco) complicating pregnancy, second trimester: Secondary | ICD-10-CM | POA: Diagnosis not present

## 2018-04-21 DIAGNOSIS — F17210 Nicotine dependence, cigarettes, uncomplicated: Secondary | ICD-10-CM | POA: Diagnosis not present

## 2018-04-21 DIAGNOSIS — N83202 Unspecified ovarian cyst, left side: Secondary | ICD-10-CM

## 2018-04-21 DIAGNOSIS — R109 Unspecified abdominal pain: Secondary | ICD-10-CM | POA: Diagnosis present

## 2018-04-21 DIAGNOSIS — N83209 Unspecified ovarian cyst, unspecified side: Secondary | ICD-10-CM

## 2018-04-21 DIAGNOSIS — Z3A20 20 weeks gestation of pregnancy: Secondary | ICD-10-CM

## 2018-04-21 DIAGNOSIS — O26892 Other specified pregnancy related conditions, second trimester: Secondary | ICD-10-CM | POA: Diagnosis not present

## 2018-04-21 DIAGNOSIS — Z3A21 21 weeks gestation of pregnancy: Secondary | ICD-10-CM | POA: Insufficient documentation

## 2018-04-21 DIAGNOSIS — O3482 Maternal care for other abnormalities of pelvic organs, second trimester: Secondary | ICD-10-CM | POA: Diagnosis not present

## 2018-04-21 LAB — URINALYSIS, ROUTINE W REFLEX MICROSCOPIC
BACTERIA UA: NONE SEEN
BILIRUBIN URINE: NEGATIVE
Bilirubin Urine: NEGATIVE
Glucose, UA: NEGATIVE mg/dL
Glucose, UA: NEGATIVE mg/dL
HGB URINE DIPSTICK: NEGATIVE
Hgb urine dipstick: NEGATIVE
KETONES UR: 20 mg/dL — AB
Ketones, ur: NEGATIVE mg/dL
NITRITE: NEGATIVE
NITRITE: NEGATIVE
PH: 7 (ref 5.0–8.0)
PROTEIN: NEGATIVE mg/dL
Protein, ur: NEGATIVE mg/dL
SPECIFIC GRAVITY, URINE: 1.009 (ref 1.005–1.030)
Specific Gravity, Urine: 1.016 (ref 1.005–1.030)
WBC, UA: >50 WBC/hpf — ABNORMAL HIGH (ref 0–5)
pH: 7 (ref 5.0–8.0)

## 2018-04-21 NOTE — MAU Note (Signed)
Cramping in abd, noted this morning. Was severe, now in "the middle".  Denies bleeding. No urinary or GI symptoms.

## 2018-04-21 NOTE — Discharge Instructions (Signed)
Abdominal Pain During Pregnancy  Belly (abdominal) pain is common during pregnancy. There are many possible causes. Most of the time, it is not a serious problem. Other times, it can be a sign that something is wrong with the pregnancy. Always tell your doctor if you have belly pain. Follow these instructions at home:  Do not have sex or put anything in your vagina until your pain goes away completely.  Get plenty of rest until your pain gets better.  Drink enough fluid to keep your pee (urine) pale yellow.  Take over-the-counter and prescription medicines only as told by your doctor.  Keep all follow-up visits as told by your doctor. This is important. Contact a doctor if:  Your pain continues or gets worse after resting.  You have lower belly pain that: ? Comes and goes at regular times. ? Spreads to your back. ? Feels like menstrual cramps.  You have pain or burning when you pee (urinate). Get help right away if:  You have a fever or chills.  You have vaginal bleeding.  You are leaking fluid from your vagina.  You are passing tissue from your vagina.  You throw up (vomit) for more than 24 hours.  You have watery poop (diarrhea) for more than 24 hours.  Your baby is moving less than usual.  You feel very weak or faint.  You have shortness of breath.  You have very bad pain in your upper belly. Summary  Belly (abdominal) pain is common during pregnancy. There are many possible causes.  If you have belly pain during pregnancy, tell your doctor right away.  Keep all follow-up visits as told by your doctor. This is important. This information is not intended to replace advice given to you by your health care provider. Make sure you discuss any questions you have with your health care provider. Document Released: 03/16/2009 Document Revised: 06/30/2016 Document Reviewed: 06/30/2016 Elsevier Interactive Patient Education  2019 [employer]  Ovarian Cyst      An ovarian cyst is a fluid-filled sac that forms on an ovary. The ovaries are small organs that produce eggs in women. Various types of cysts can form on the ovaries. Some may cause symptoms and require treatment. Most ovarian cysts go away on their own, are not cancerous (are benign), and do not cause problems. Common types of ovarian cysts include:  Functional (follicle) cysts. ? Occur during the menstrual cycle, and usually go away with the next menstrual cycle if you do not get pregnant. ? Usually cause no symptoms.  Endometriomas. ? Are cysts that form from the tissue that lines the uterus (endometrium). ? Are sometimes called chocolate cysts because they become filled with blood that turns brown. ? Can cause pain in the lower abdomen during intercourse and during your period.  Cystadenoma cysts. ? Develop from cells on the outside surface of the ovary. ? Can get very large and cause lower abdomen pain and pain with intercourse. ? Can cause severe pain if they twist or break open (rupture).  Dermoid cysts. ? Are sometimes found in both ovaries. ? May contain different kinds of body tissue, such as skin, teeth, hair, or cartilage. ? Usually do not cause symptoms unless they get very big.  Theca lutein cysts. ? Occur when too much of a certain hormone (human chorionic gonadotropin) is produced and overstimulates the ovaries to produce an egg. ? Are most common after having procedures used to assist with the conception of a baby (in vitro  fertilization). What are the causes? Ovarian cysts may be caused by:  Ovarian hyperstimulation syndrome. This is a condition that can develop from taking fertility medicines. It causes multiple large ovarian cysts to form.  Polycystic ovarian syndrome (PCOS). This is a common hormonal disorder that can cause ovarian cysts, as well as problems with your period or fertility. What increases the risk? The following factors may make you more  likely to develop ovarian cysts:  Being overweight or obese.  Taking fertility medicines.  Taking certain forms of hormonal birth control.  Smoking. What are the signs or symptoms? Many ovarian cysts do not cause symptoms. If symptoms are present, they may include:  Pelvic pain or pressure.  Pain in the lower abdomen.  Pain during sex.  Abdominal swelling.  Abnormal menstrual periods.  Increasing pain with menstrual periods. How is this diagnosed? These cysts are commonly found during a routine pelvic exam. You may have tests to find out more about the cyst, such as:  Ultrasound.  X-ray of the pelvis.  CT scan.  MRI.  Blood tests. How is this treated? Many ovarian cysts go away on their own without treatment. Your health care provider may want to check your cyst regularly for 2-3 months to see if it changes. If you are in menopause, it is especially important to have your cyst monitored closely because menopausal women have a higher rate of ovarian cancer. When treatment is needed, it may include:  Medicines to help relieve pain.  A procedure to drain the cyst (aspiration).  Surgery to remove the whole cyst.  Hormone treatment or birth control pills. These methods are sometimes used to help dissolve a cyst. Follow these instructions at home:  Take over-the-counter and prescription medicines only as told by your health care provider.  Do not drive or use heavy machinery while taking prescription pain medicine.  Get regular pelvic exams and Pap tests as often as told by your health care provider.  Return to your normal activities as told by your health care provider. Ask your health care provider what activities are safe for you.  Do not use any products that contain nicotine or tobacco, such as cigarettes and e-cigarettes. If you need help quitting, ask your health care provider.  Keep all follow-up visits as told by your health care provider. This is  important. Contact a health care provider if:  Your periods are late, irregular, or painful, or they stop.  You have pelvic pain that does not go away.  You have pressure on your bladder or trouble emptying your bladder completely.  You have pain during sex.  You have any of the following in your abdomen: ? A feeling of fullness. ? Pressure. ? Discomfort. ? Pain that does not go away. ? Swelling.  You feel generally ill.  You become constipated.  You lose your appetite.  You develop severe acne.  You start to have more body hair and facial hair.  You are gaining weight or losing weight without changing your exercise and eating habits.  You think you may be pregnant. Get help right away if:  You have abdominal pain that is severe or gets worse.  You cannot eat or drink without vomiting.  You suddenly develop a fever.  Your menstrual period is much heavier than usual. This information is not intended to replace advice given to you by your health care provider. Make sure you discuss any questions you have with your health care provider. Document Released: 03/28/2005  Document Revised: 10/16/2015 Document Reviewed: 08/30/2015 Elsevier Interactive Patient Education  [employer].

## 2018-04-21 NOTE — MAU Provider Note (Signed)
Chief Complaint: Abdominal Pain   First Provider Initiated Contact with Patient 04/21/18 0914     SUBJECTIVE HPI: Christy Hooper is a 18 y.o. G1P0 at [redacted]w[redacted]d who presents to Maternity Admissions reporting abdominal cramping. Symptoms started this morning. Can't tell if pain is intermittent or constant. Denies n/v/d, constipation, dysuria, LOF, or vaginal bleeding. Last intercourse 2 days ago.   Location: abdomen Quality: cramping Severity: 5/10 on pain scale Duration: <1 day Timing: intermittent & constant Modifying factors: none Associated signs and symptoms: none  Past Medical History:  Diagnosis Date  . Asthma    OB History  Gravida Para Term Preterm AB Living  1            SAB TAB Ectopic Multiple Live Births               # Outcome Date GA Lbr Len/2nd Weight Sex Delivery Anes PTL Lv  1 Current            Past Surgical History:  Procedure Laterality Date  . TONSILLECTOMY    . WISDOM TOOTH EXTRACTION     Social History   Socioeconomic History  . Marital status: Single    Spouse name: Not on file  . Number of children: Not on file  . Years of education: Not on file  . Highest education level: Not on file  Occupational History  . Not on file  Social Needs  . Financial resource strain: Not on file  . Food insecurity:    Worry: Not on file    Inability: Not on file  . Transportation needs:    Medical: Not on file    Non-medical: Not on file  Tobacco Use  . Smoking status: Current Some Day Smoker    Packs/day: 0.25    Types: Cigarettes, E-cigarettes  . Smokeless tobacco: Never Used  . Tobacco comment: "few cigarettes per week"  Substance and Sexual Activity  . Alcohol use: Yes    Comment: 1 bottle of liqour per week   . 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 Sexual activity: Yes    Birth control/protection: None  Lifestyle  . Physical activity:    Days per week: Not on file    Minutes per session: Not on file  . Stress: Not on file   Relationships  . Social connections:    Talks on phone: Not on file    Gets together: Not on file    Attends religious service: Not on file    Active member of club or organization: Not on file    Attends meetings of clubs or organizations: Not on file    Relationship status: Not on file  . Intimate partner violence:    Fear of current or ex partner: Not on file    Emotionally abused: Not on file    Physically abused: Not on file    Forced sexual activity: Not on file  Other Topics Concern  . Not on file  Social History Narrative  . Not on file   Family History  Problem Relation Age of Onset  . Cancer Paternal Aunt   . Depression Maternal Grandmother   . Depression Maternal Grandfather   . Cancer Other    No current facility-administered medications on file prior to encounter.    Current Outpatient Medications on File Prior to Encounter  Medication Sig Dispense Refill  . ondansetron (ZOFRAN ODT) 4 MG disintegrating tablet Take 1 tablet (  4 mg total) by mouth every 6 (six) hours as needed for nausea. 20 tablet 0  . Prenatal Vit-Fe Fumarate-FA (PRENATAL COMPLETE) 14-0.4 MG TABS Take 1 tablet by mouth daily. 60 each 0  . promethazine (PHENERGAN) 25 MG tablet Take 1 tablet (25 mg total) by mouth every 6 (six) hours as needed for nausea or vomiting. 30 tablet 1   No Known Allergies  I have reviewed patient's Past Medical Hx, Surgical Hx, Family Hx, Social Hx, medications and allergies.   Review of Systems  Constitutional: Negative.   Gastrointestinal: Positive for abdominal pain. Negative for diarrhea, nausea and vomiting.  Genitourinary: Negative.     OBJECTIVE Patient Vitals for the past 24 hrs:  BP Temp Temp src Pulse Resp  04/21/18 1058 (!) 104/58 - - 79 -  04/21/18 0849 (!) 96/58 98.6 F (37 C) Oral 82 16   Constitutional: Well-developed, well-nourished female in no acute distress.  Cardiovascular: normal rate & rhythm, no murmur Respiratory: normal rate and  effort. Lung sounds clear throughout GI: Abd soft, non-tender, Pos BS x 4. No guarding or rebound tenderness MS: Extremities nontender, no edema, normal ROM Neurologic: Alert and oriented x 4.  GU:   Cervix closed/thick    LAB RESULTS Results for orders placed or performed during the hospital encounter of 04/21/18 (from the past 24 hour(s))  Urinalysis, Routine w reflex microscopic     Status: Abnormal   Collection Time: 04/21/18  8:47 AM  Result Value Ref Range   Color, Urine YELLOW YELLOW   APPearance CLOUDY (A) CLEAR   Specific Gravity, Urine 1.009 1.005 - 1.030   pH 7.0 5.0 - 8.0   Glucose, UA NEGATIVE NEGATIVE mg/dL   Hgb urine dipstick NEGATIVE NEGATIVE   Bilirubin Urine NEGATIVE NEGATIVE   Ketones, ur NEGATIVE NEGATIVE mg/dL   Protein, ur NEGATIVE NEGATIVE mg/dL   Nitrite NEGATIVE NEGATIVE   Leukocytes, UA LARGE (A) NEGATIVE   RBC / HPF 6-10 0 - 5 RBC/hpf   WBC, UA >50 (H) 0 - 5 WBC/hpf   Bacteria, UA FEW (A) NONE SEEN   Squamous Epithelial / LPF >50 (H) 0 - 5   Mucus PRESENT   Urinalysis, Routine w reflex microscopic     Status: Abnormal   Collection Time: 04/21/18  9:42 AM  Result Value Ref Range   Color, Urine YELLOW YELLOW   APPearance CLEAR CLEAR   Specific Gravity, Urine 1.016 1.005 - 1.030   pH 7.0 5.0 - 8.0   Glucose, UA NEGATIVE NEGATIVE mg/dL   Hgb urine dipstick NEGATIVE NEGATIVE   Bilirubin Urine NEGATIVE NEGATIVE   Ketones, ur 20 (A) NEGATIVE mg/dL   Protein, ur NEGATIVE NEGATIVE mg/dL   Nitrite NEGATIVE NEGATIVE   Leukocytes, UA TRACE (A) NEGATIVE   RBC / HPF 0-5 0 - 5 RBC/hpf   WBC, UA 0-5 0 - 5 WBC/hpf   Bacteria, UA NONE SEEN NONE SEEN   Squamous Epithelial / LPF 6-10 0 - 5   Mucus PRESENT     IMAGING [place] Mfm Ob Transvaginal  Result Date: 04/21/2018 ----------------------------------------------------------------------  OBSTETRICS REPORT                        (Signed Final 04/21/2018 10:49 am)  ---------------------------------------------------------------------- Patient Info  ID #:       [id number]                          D.O.B.:  2000-04-25 (17 yrs)  Name:       Christy Hooper                    Visit Date: 04/21/2018 09:47 am ---------------------------------------------------------------------- Performed By  Performed By:     Velna Hatchet Driskill        Ref. Address:      [address]                                                              [place]                                                              [place] [address]  Attending:        Noralee Space MD        Location:          [facility]  Referred By:      Jorje Guild CNM ---------------------------------------------------------------------- Orders   #  Description                          Code         Ordered By   1  [place] MFM OB TRANSVAGINAL               [phone]      Judeth Horn  ----------------------------------------------------------------------   #  Order #                    Accession #                 Episode #   1  [id number]                  [id number]                  [id number]  ---------------------------------------------------------------------- Indications   [redacted] weeks gestation of pregnancy                Z3A.21   Abdominal pain in pregnancy                    O99.89  ---------------------------------------------------------------------- Vital Signs                                                 Height:        5'3" ---------------------------------------------------------------------- Fetal Evaluation  Num Of Fetuses:          1  Fetal Heart Rate(bpm):   157  Cardiac Activity:        Observed  Amniotic Fluid  AFI FV:      Within normal limits ---------------------------------------------------------------------- OB History  Gravidity:    1 ---------------------------------------------------------------------- Gestational Age  LMP:           21w 1d        Date:  11/24/17                  EDD:   08/31/18  Best:          21w 1d     Det. By:  LMP  (11/24/17)          EDD:   08/31/18 ---------------------------------------------------------------------- Cervix Uterus Adnexa  Cervix  Length:           4.56  cm.  Left Ovary  Complex cystic area = 7.0 x 4.2 x 6.5 cm ---------------------------------------------------------------------- Impression  Patient, G1 P0 at 21-weeks' gestation, presented to MAU with  c/o abdominal cramping.  A limited ultrasound study showed normal amniotic fluid.  Good fetal activity is seen.  On transvaginal scan, the cervix measures 4.6 cm, which is  normal.  A complex left ovarian cyst, measuring 7 x 4.2 x 6.5 cm, is  seen. ---------------------------------------------------------------------- Recommendations  Follow-up evaluation of left ovarian cyst (Radiology)  recommended. ----------------------------------------------------------------------                  Noralee Space, MD Electronically Signed Final Report   04/21/2018 10:49 am ----------------------------------------------------------------------   MAU COURSE Orders Placed This Encounter  Procedures  . Culture, OB Urine  . [place] MFM OB Transvaginal  . Urinalysis, Routine w reflex microscopic  . Urinalysis, Routine w reflex microscopic  . Discharge patient   No orders of the defined types were placed in this encounter.   MDM FHT present via doppler Cervix closed. TVUS ordered -- CL 4.5 cm. Left ovarian complex cyst measuring 7 cm. D/w ultrasonographer; did get color flow to ovary & will update her report.   ASSESSMENT 1. Ovarian cyst affecting pregnancy in second trimester, antepartum   2. [redacted] weeks gestation of pregnancy   3. Abdominal pain during pregnancy in second trimester     PLAN Discharge home in stable condition. Preterm labor & ovarian torsion precautions  Allergies as of 04/21/2018   No Known Allergies     Medication List    TAKE these medications   ondansetron 4  MG disintegrating tablet Commonly known as:  ZOFRAN ODT Take 1 tablet (4 mg total) by mouth every 6 (six) hours as needed for nausea.   PRENATAL COMPLETE 14-0.4 MG Tabs Take 1 tablet by mouth daily.   promethazine 25 MG tablet Commonly known as:  PHENERGAN Take 1 tablet (25 mg total) by mouth every 6 (six) hours as needed for nausea or vomiting.        Judeth Horn, NP 04/21/2018  3:47 PM

## 2018-04-24 ENCOUNTER — Encounter: Payer: Self-pay | Admitting: Student

## 2018-04-24 DIAGNOSIS — R8271 Bacteriuria: Secondary | ICD-10-CM | POA: Insufficient documentation

## 2018-04-24 LAB — CULTURE, OB URINE: Culture: 1000 — AB

## 2018-05-04 ENCOUNTER — Encounter: Payer: Self-pay | Admitting: Nurse Practitioner

## 2018-05-04 ENCOUNTER — Ambulatory Visit (INDEPENDENT_AMBULATORY_CARE_PROVIDER_SITE_OTHER): Payer: Medicaid Other | Admitting: Nurse Practitioner

## 2018-05-04 VITALS — BP 114/65 | HR 86 | Wt 140.5 lb

## 2018-05-04 DIAGNOSIS — O3480 Maternal care for other abnormalities of pelvic organs, unspecified trimester: Secondary | ICD-10-CM

## 2018-05-04 DIAGNOSIS — Z3480 Encounter for supervision of other normal pregnancy, unspecified trimester: Secondary | ICD-10-CM

## 2018-05-04 DIAGNOSIS — Z3482 Encounter for supervision of other normal pregnancy, second trimester: Secondary | ICD-10-CM

## 2018-05-04 DIAGNOSIS — N83209 Unspecified ovarian cyst, unspecified side: Secondary | ICD-10-CM | POA: Insufficient documentation

## 2018-05-04 DIAGNOSIS — O3482 Maternal care for other abnormalities of pelvic organs, second trimester: Secondary | ICD-10-CM

## 2018-05-04 NOTE — Progress Notes (Signed)
    Subjective:  Christy Hooper is a 18 y.o. G1P0 at 101w0d being seen today for ongoing prenatal care.  She is currently monitored for the following issues for this low-risk pregnancy and has Supervision of other normal pregnancy, antepartum; Mild asthma; GBS bacteriuria; and Ovarian cyst affecting pregnancy, antepartum on their problem list.  Patient reports pain from her 7 cm cyst.  Contractions: Not present. Vag. Bleeding: None.  Movement: Present. Denies leaking of fluid.   The following portions of the patient's history were reviewed and updated as appropriate: allergies, current medications, past family history, past medical history, past social history, past surgical history and problem list. Problem list updated.  Objective:   Vitals:   05/04/18 0911  BP: 114/65  Pulse: 86  Weight: 140 lb 8 oz (63.7 kg)    Fetal Status: Fetal Heart Rate (bpm): 157 Fundal Height: 23 cm Movement: Present     General:  Alert, oriented and cooperative. Patient is in no acute distress.  Skin: Skin is warm and dry. No rash noted.   Cardiovascular: Normal heart rate noted  Respiratory: Normal respiratory effort, no problems with respiration noted  Abdomen: Soft, gravid, appropriate for gestational age. Pain/Pressure: Present     Pelvic:  Cervical exam deferred        Extremities: Normal range of motion.  Edema: None  Mental Status: Normal mood and affect. Normal behavior. Normal judgment and thought content.   Urinalysis:      Assessment and Plan:  Pregnancy: G1P0 at [redacted]w[redacted]d  1. Supervision of other normal pregnancy, antepartum Having some pain from the cyst.  Taking tylenol occasionally Has a plan for childbirth and breastfeeding classes.  2. Ovarian cyst affecting pregnancy, antepartum Scheduled follow up transvaginal ultrasound Reviewed relaxation techniques to use for mild pain Reviewed torsion precautions and preterm labor precautions  - [place] MFM OB Transvaginal; Future  Preterm labor  symptoms and general obstetric precautions including but not limited to vaginal bleeding, contractions, leaking of fluid and fetal movement were reviewed in detail with the patient. Please refer to After Visit Summary for other counseling recommendations.  Return in about 3 weeks (around 05/25/2018).  Nolene Bernheim, RN, MSN, NP-BC Nurse Practitioner, [facility] for [employer], [facility] 05/04/2018 12:14 PM

## 2018-05-04 NOTE — Progress Notes (Signed)
Pt states that her cycst is bothering her

## 2018-05-18 ENCOUNTER — Ambulatory Visit (HOSPITAL_COMMUNITY)
Admission: RE | Admit: 2018-05-18 | Discharge: 2018-05-18 | Disposition: A | Discharge status: Home / Self Care | Payer: BLUE CROSS/BLUE SHIELD | Source: Ambulatory Visit | Attending: Nurse Practitioner | Admitting: Nurse Practitioner

## 2018-05-18 DIAGNOSIS — Z3A25 25 weeks gestation of pregnancy: Secondary | ICD-10-CM

## 2018-05-18 DIAGNOSIS — O3480 Maternal care for other abnormalities of pelvic organs, unspecified trimester: Secondary | ICD-10-CM | POA: Diagnosis not present

## 2018-05-18 DIAGNOSIS — N83209 Unspecified ovarian cyst, unspecified side: Secondary | ICD-10-CM | POA: Diagnosis present

## 2018-05-29 ENCOUNTER — Other Ambulatory Visit: Payer: BLUE CROSS/BLUE SHIELD

## 2018-05-29 ENCOUNTER — Ambulatory Visit (INDEPENDENT_AMBULATORY_CARE_PROVIDER_SITE_OTHER): Payer: BLUE CROSS/BLUE SHIELD | Admitting: Student

## 2018-05-29 DIAGNOSIS — Z3482 Encounter for supervision of other normal pregnancy, second trimester: Secondary | ICD-10-CM

## 2018-05-29 DIAGNOSIS — Z3480 Encounter for supervision of other normal pregnancy, unspecified trimester: Secondary | ICD-10-CM

## 2018-05-29 DIAGNOSIS — Z23 Encounter for immunization: Secondary | ICD-10-CM

## 2018-05-29 DIAGNOSIS — Z3A26 26 weeks gestation of pregnancy: Secondary | ICD-10-CM

## 2018-05-29 MED ORDER — COMFORT FIT MATERNITY SUPP MED MISC: 0 refills | Status: DC

## 2018-05-29 NOTE — Progress Notes (Signed)
   PRENATAL VISIT NOTE  Subjective:  Christy Hooper is a 18 y.o. G1P0 at [redacted]w[redacted]d being seen today for ongoing prenatal care.  She is currently monitored for the following issues for this low-risk pregnancy and has Supervision of other normal pregnancy, antepartum; Mild asthma; GBS bacteriuria; and Ovarian cyst affecting pregnancy, antepartum on their problem list.  Patient reports abdominal pain on her left and right lower quadrants. She also ate some leftovers yesterday and has been having some occasional vomiting and diarrhea. .  Patient is also anxious about baby's movements (irregular).  Contractions: Not present. Vag. Bleeding: None.  Movement: Present. Denies leaking of fluid.   The following portions of the patient's history were reviewed and updated as appropriate: allergies, current medications, past family history, past medical history, past social history, past surgical history and problem list. Problem list updated.  Objective:   Vitals:   05/29/18 0846  BP: 115/67  Pulse: 88  Weight: 145 lb 12.8 oz (66.1 kg)    Fetal Status: Fetal Heart Rate (bpm): 152 Fundal Height: 26 cm Movement: Present     General:  Alert, oriented and cooperative. Patient is in no acute distress.  Skin: Skin is warm and dry. No rash noted.   Cardiovascular: Normal heart rate noted  Respiratory: Normal respiratory effort, no problems with respiration noted  Abdomen: Soft, gravid, appropriate for gestational age.  Pain/Pressure: Present     Pelvic: Cervical exam deferred        Extremities: Normal range of motion.  Edema: None  Mental Status: Normal mood and affect. Normal behavior. Normal judgment and thought content.   Assessment and Plan:  Pregnancy: G1P0 at 103w4d  1. Supervision of other normal pregnancy, antepartum -Reviewed s/s of preterm labor vs. RLP vs. Cyst pain. Provided emotional support and information on differentiating the three complaints, as well as fetal kick counts and appropriate  fetal movement at this gestation.  - Tdap vaccine greater than or equal to 7yo IM -2 hour today  Preterm labor symptoms and general obstetric precautions including but not limited to vaginal bleeding, contractions, leaking of fluid and fetal movement were reviewed in detail with the patient. Please refer to After Visit Summary for other counseling recommendations.  Return in about 2 weeks (around 06/12/2018), or LROB.  Future Appointments  Date Time Provider Department Center  06/13/2018  9:35 AM Allie Bossier, MD [facility]  06/27/2018  9:35 AM Marny Lowenstein, Cordelia Poche [facility]  07/11/2018  9:35 AM Allie Bossier, MD [facility] [address] [place], [place]

## 2018-05-29 NOTE — Patient Instructions (Signed)

## 2018-05-30 LAB — GLUCOSE TOLERANCE, 2 HOURS W/ 1HR
GLUCOSE, 1 HOUR: 114 mg/dL (ref 65–179)
Glucose, 2 hour: 80 mg/dL (ref 65–152)
Glucose, Fasting: 80 mg/dL (ref 65–91)

## 2018-05-30 LAB — HIV ANTIBODY (ROUTINE TESTING W REFLEX): HIV Screen 4th Generation wRfx: NONREACTIVE

## 2018-05-30 LAB — CBC
Hematocrit: 36.6 % (ref 34.0–46.6)
Hemoglobin: 12.3 g/dL (ref 11.1–15.9)
MCH: 30.8 pg (ref 26.6–33.0)
MCHC: 33.6 g/dL (ref 31.5–35.7)
MCV: 92 fL (ref 79–97)
PLATELETS: 190 10*3/uL (ref 150–450)
RBC: 3.99 x10E6/uL (ref 3.77–5.28)
RDW: 13 % (ref 11.7–15.4)
WBC: 7 10*3/uL (ref 3.4–10.8)

## 2018-05-30 LAB — RPR: RPR Ser Ql: NONREACTIVE

## 2018-06-13 ENCOUNTER — Ambulatory Visit (INDEPENDENT_AMBULATORY_CARE_PROVIDER_SITE_OTHER): Payer: BLUE CROSS/BLUE SHIELD | Admitting: Nurse Practitioner

## 2018-06-13 ENCOUNTER — Encounter: Payer: BLUE CROSS/BLUE SHIELD | Admitting: Obstetrics & Gynecology

## 2018-06-13 ENCOUNTER — Encounter: Payer: Self-pay | Admitting: Nurse Practitioner

## 2018-06-13 VITALS — BP 98/59 | HR 91 | Wt 150.0 lb

## 2018-06-13 DIAGNOSIS — Z3480 Encounter for supervision of other normal pregnancy, unspecified trimester: Secondary | ICD-10-CM

## 2018-06-13 DIAGNOSIS — O3480 Maternal care for other abnormalities of pelvic organs, unspecified trimester: Secondary | ICD-10-CM

## 2018-06-13 DIAGNOSIS — Z3A28 28 weeks gestation of pregnancy: Secondary | ICD-10-CM

## 2018-06-13 DIAGNOSIS — O3483 Maternal care for other abnormalities of pelvic organs, third trimester: Secondary | ICD-10-CM

## 2018-06-13 DIAGNOSIS — N83209 Unspecified ovarian cyst, unspecified side: Secondary | ICD-10-CM

## 2018-06-13 MED ORDER — COMFORT FIT MATERNITY SUPP MED MISC: 0 refills | Status: DC

## 2018-06-13 NOTE — Progress Notes (Signed)
    Subjective:  Christy Hooper is a 18 y.o. G1P0 at [redacted]w[redacted]d being seen today for ongoing prenatal care.  She is currently monitored for the following issues for this low-risk pregnancy and has Supervision of other normal pregnancy, antepartum; Mild asthma; GBS bacteriuria; and Ovarian cyst affecting pregnancy, antepartum on their problem list.  Patient reports still has occasional pain from cyst but is not worsening..  Contractions: Not present. Vag. Bleeding: None.  Movement: Present. Denies leaking of fluid.   The following portions of the patient's history were reviewed and updated as appropriate: allergies, current medications, past family history, past medical history, past social history, past surgical history and problem list. Problem list updated.  Objective:   Vitals:   06/13/18 1107  BP: (!) 98/59  Pulse: 91  Weight: 150 lb (68 kg)    Fetal Status: Fetal Heart Rate (bpm): 152 Fundal Height: 26 cm Movement: Present     General:  Alert, oriented and cooperative. Patient is in no acute distress.  Skin: Skin is warm and dry. No rash noted.   Cardiovascular: Normal heart rate noted  Respiratory: Normal respiratory effort, no problems with respiration noted  Abdomen: Soft, gravid, appropriate for gestational age. Pain/Pressure: Present     Pelvic:  Cervical exam deferred        Extremities: Normal range of motion.  Edema: Trace  Mental Status: Normal mood and affect. Normal behavior. Normal judgment and thought content.   Urinalysis:      Assessment and Plan:  Pregnancy: G1P0 at [redacted]w[redacted]d  1. Supervision of other normal pregnancy, antepartum Very little pain from cyst.  Comes and goes but is not worsening.  2. Ovarian cyst affecting pregnancy, antepartum Consult with Dr. Debroah Loop.  OK to have another [place] at 36 weeks to assess cyst if client's pain is not worsening.  Preterm labor symptoms and general obstetric precautions including but not limited to vaginal bleeding, contractions,  leaking of fluid and fetal movement were reviewed in detail with the patient. Please refer to After Visit Summary for other counseling recommendations.  Return in about 2 weeks (around 06/27/2018).  Nolene Bernheim, RN, MSN, NP-BC Nurse Practitioner, [facility] for [employer], [facility] 06/13/2018 11:35 AM

## 2018-06-27 ENCOUNTER — Ambulatory Visit (INDEPENDENT_AMBULATORY_CARE_PROVIDER_SITE_OTHER): Payer: BLUE CROSS/BLUE SHIELD | Admitting: Medical

## 2018-06-27 ENCOUNTER — Encounter: Payer: Self-pay | Admitting: Medical

## 2018-06-27 ENCOUNTER — Other Ambulatory Visit: Payer: Self-pay

## 2018-06-27 VITALS — BP 114/74 | HR 101 | Wt 155.2 lb

## 2018-06-27 DIAGNOSIS — N83209 Unspecified ovarian cyst, unspecified side: Secondary | ICD-10-CM

## 2018-06-27 DIAGNOSIS — R8271 Bacteriuria: Secondary | ICD-10-CM

## 2018-06-27 DIAGNOSIS — Z3A30 30 weeks gestation of pregnancy: Secondary | ICD-10-CM

## 2018-06-27 DIAGNOSIS — Z3480 Encounter for supervision of other normal pregnancy, unspecified trimester: Secondary | ICD-10-CM

## 2018-06-27 DIAGNOSIS — O98812 Other maternal infectious and parasitic diseases complicating pregnancy, second trimester: Secondary | ICD-10-CM

## 2018-06-27 DIAGNOSIS — O3480 Maternal care for other abnormalities of pelvic organs, unspecified trimester: Secondary | ICD-10-CM

## 2018-06-27 NOTE — Progress Notes (Signed)
   PRENATAL VISIT NOTE  Subjective:  Christy Hooper is a 18 y.o. G1P0 at [redacted]w[redacted]d being seen today for ongoing prenatal care.  She is currently monitored for the following issues for this low-risk pregnancy and has Supervision of other normal pregnancy, antepartum; Mild asthma; GBS bacteriuria; and Ovarian cyst affecting pregnancy, antepartum on their problem list.  Patient reports itching of the breasts.  Contractions: Not present. Vag. Bleeding: None.  Movement: Present. Denies leaking of fluid.   The following portions of the patient's history were reviewed and updated as appropriate: allergies, current medications, past family history, past medical history, past social history, past surgical history and problem list.   Objective:   Vitals:   06/27/18 0850  BP: 114/74  Pulse: (!) 101  Weight: 155 lb 3.2 oz (70.4 kg)    Fetal Status: Fetal Heart Rate (bpm): 145 Fundal Height: 31 cm Movement: Present     General:  Alert, oriented and cooperative. Patient is in no acute distress.  Skin: Skin is warm and dry. No rash noted.   Cardiovascular: Normal heart rate noted  Respiratory: Normal respiratory effort, no problems with respiration noted  Abdomen: Soft, gravid, appropriate for gestational age.  Pain/Pressure: Present     Pelvic: Cervical exam deferred        Extremities: Normal range of motion.  Edema: None  Mental Status: Normal mood and affect. Normal behavior. Normal judgment and thought content.   Assessment and Plan:  Pregnancy: G1P0 at [redacted]w[redacted]d 1. Supervision of other normal pregnancy, antepartum - Doing well - Diffuse itching of the breast without rash. No focal redness or pain. Advised warm showers (not hot) and moisturizer like Aveeno eczema or Aquafor.  - Encouraged patient to call ahead if experiencing respiratory symptoms with fever so that she can be triaged and tested/directed appropriately   2. GBS bacteriuria - Treat in labor  3. Ovarian cyst affecting pregnancy,  antepartum - Repeat US around 36 weeks, plan to schedule at next visit   Preterm labor symptoms and general obstetric precautions including but not limited to vaginal bleeding, contractions, leaking of fluid and fetal movement were reviewed in detail with the patient. Please refer to After Visit Summary for other counseling recommendations.   Return in about 2 weeks (around 07/11/2018) for LOB.  Future Appointments  Date Time Provider Department Center  06/27/2018  9:35 AM Kathlene [facility]  07/11/2018  9:35 AM Marvetta Gibbons, Brand Males, NP [facility]    Vonzella Nipple, PA-C

## 2018-06-27 NOTE — Progress Notes (Signed)
Pt states breast are itching real bad, has not tried any creams.Wnats to know how many lbs she can lift and how often.

## 2018-06-27 NOTE — Patient Instructions (Signed)
Fetal Movement Counts Patient Name: ________________________________________________ Patient Due Date: ____________________ What is a fetal movement count?  A fetal movement count is the number of times that you feel your baby move during a certain amount of time. This may also be called a fetal kick count. A fetal movement count is recommended for every pregnant woman. You may be asked to start counting fetal movements as early as week 28 of your pregnancy. Pay attention to when your baby is most active. You may notice your baby's sleep and wake cycles. You may also notice things that make your baby move more. You should do a fetal movement count:  When your baby is normally most active.  At the same time each day. A good time to count movements is while you are resting, after having something to eat and drink. How do I count fetal movements? 1. Find a quiet, comfortable area. Sit, or lie down on your side. 2. Write down the date, the start time and stop time, and the number of movements that you felt between those two times. Take this information with you to your health care visits. 3. For 2 hours, count kicks, flutters, swishes, rolls, and jabs. You should feel at least 10 movements during 2 hours. 4. You may stop counting after you have felt 10 movements. 5. If you do not feel 10 movements in 2 hours, have something to eat and drink. Then, keep resting and counting for 1 hour. If you feel at least 4 movements during that hour, you may stop counting. Contact a health care provider if:  You feel fewer than 4 movements in 2 hours.  Your baby is not moving like he or she usually does. Date: ____________ Start time: ____________ Stop time: ____________ Movements: ____________ Date: ____________ Start time: ____________ Stop time: ____________ Movements: ____________ Date: ____________ Start time: ____________ Stop time: ____________ Movements: ____________ Date: ____________ Start time:  ____________ Stop time: ____________ Movements: ____________ Date: ____________ Start time: ____________ Stop time: ____________ Movements: ____________ Date: ____________ Start time: ____________ Stop time: ____________ Movements: ____________ Date: ____________ Start time: ____________ Stop time: ____________ Movements: ____________ Date: ____________ Start time: ____________ Stop time: ____________ Movements: ____________ Date: ____________ Start time: ____________ Stop time: ____________ Movements: ____________ This information is not intended to replace advice given to you by your health care provider. Make sure you discuss any questions you have with your health care provider. Document Released: 04/27/2006 Document Revised: 11/25/2015 Document Reviewed: 05/07/2015 Elsevier Interactive Patient Education  2019 [employer] Braxton Hicks Contractions Contractions of the uterus can occur throughout pregnancy, but they are not always a sign that you are in labor. You may have practice contractions called Braxton Hicks contractions. These false labor contractions are sometimes confused with true labor. What are Braxton Hicks contractions? Braxton Hicks contractions are tightening movements that occur in the muscles of the uterus before labor. Unlike true labor contractions, these contractions do not result in opening (dilation) and thinning of the cervix. Toward the end of pregnancy (32-34 weeks), Braxton Hicks contractions can happen more often and may become stronger. These contractions are sometimes difficult to tell apart from true labor because they can be very uncomfortable. You should not feel embarrassed if you go to the hospital with false labor. Sometimes, the only way to tell if you are in true labor is for your health care provider to look for changes in the cervix. The health care provider will do a physical exam and may monitor your contractions. If   you are not in true labor, the exam  should show that your cervix is not dilating and your water has not broken. If there are no other health problems associated with your pregnancy, it is completely safe for you to be sent home with false labor. You may continue to have Braxton Hicks contractions until you go into true labor. How to tell the difference between true labor and false labor True labor  Contractions last 30-70 seconds.  Contractions become very regular.  Discomfort is usually felt in the top of the uterus, and it spreads to the lower abdomen and low back.  Contractions do not go away with walking.  Contractions usually become more intense and increase in frequency.  The cervix dilates and gets thinner. False labor  Contractions are usually shorter and not as strong as true labor contractions.  Contractions are usually irregular.  Contractions are often felt in the front of the lower abdomen and in the groin.  Contractions may go away when you walk around or change positions while lying down.  Contractions get weaker and are shorter-lasting as time goes on.  The cervix usually does not dilate or become thin. Follow these instructions at home:   Take over-the-counter and prescription medicines only as told by your health care provider.  Keep up with your usual exercises and follow other instructions from your health care provider.  Eat and drink lightly if you think you are going into labor.  If Braxton Hicks contractions are making you uncomfortable: ? Change your position from lying down or resting to walking, or change from walking to resting. ? Sit and rest in a tub of warm water. ? Drink enough fluid to keep your urine pale yellow. Dehydration may cause these contractions. ? Do slow and deep breathing several times an hour.  Keep all follow-up prenatal visits as told by your health care provider. This is important. Contact a health care provider if:  You have a fever.  You have continuous  pain in your abdomen. Get help right away if:  Your contractions become stronger, more regular, and closer together.  You have fluid leaking or gushing from your vagina.  You pass blood-tinged mucus (bloody show).  You have bleeding from your vagina.  You have low back pain that you never had before.  You feel your baby's head pushing down and causing pelvic pressure.  Your baby is not moving inside you as much as it used to. Summary  Contractions that occur before labor are called Braxton Hicks contractions, false labor, or practice contractions.  Braxton Hicks contractions are usually shorter, weaker, farther apart, and less regular than true labor contractions. True labor contractions usually become progressively stronger and regular, and they become more frequent.  Manage discomfort from Braxton Hicks contractions by changing position, resting in a warm bath, drinking plenty of water, or practicing deep breathing. This information is not intended to replace advice given to you by your health care provider. Make sure you discuss any questions you have with your health care provider. Document Released: 08/11/2016 Document Revised: 01/10/2017 Document Reviewed: 08/11/2016 Elsevier Interactive Patient Education  2019 Else[employer] Inc.  

## 2018-07-02 ENCOUNTER — Other Ambulatory Visit: Payer: Self-pay | Admitting: *Deleted

## 2018-07-02 DIAGNOSIS — B3731 Acute candidiasis of vulva and vagina: Secondary | ICD-10-CM

## 2018-07-02 DIAGNOSIS — B373 Candidiasis of vulva and vagina: Secondary | ICD-10-CM

## 2018-07-02 MED ORDER — TERCONAZOLE 0.4 % VA CREA: 1.0000 | TOPICAL_CREAM | Freq: Every day | VAGINAL | 0 refills | Status: AC

## 2018-07-02 NOTE — Progress Notes (Signed)
Orders placed for terconazole for yeast per pt request.

## 2018-07-11 ENCOUNTER — Ambulatory Visit (INDEPENDENT_AMBULATORY_CARE_PROVIDER_SITE_OTHER): Payer: BLUE CROSS/BLUE SHIELD | Admitting: Nurse Practitioner

## 2018-07-11 ENCOUNTER — Encounter: Payer: BLUE CROSS/BLUE SHIELD | Admitting: Obstetrics & Gynecology

## 2018-07-11 ENCOUNTER — Encounter: Payer: Self-pay | Admitting: Nurse Practitioner

## 2018-07-11 DIAGNOSIS — Z3A31 31 weeks gestation of pregnancy: Secondary | ICD-10-CM

## 2018-07-11 DIAGNOSIS — Z3480 Encounter for supervision of other normal pregnancy, unspecified trimester: Secondary | ICD-10-CM

## 2018-07-11 DIAGNOSIS — O3483 Maternal care for other abnormalities of pelvic organs, third trimester: Secondary | ICD-10-CM

## 2018-07-11 DIAGNOSIS — O3480 Maternal care for other abnormalities of pelvic organs, unspecified trimester: Secondary | ICD-10-CM

## 2018-07-11 DIAGNOSIS — N83209 Unspecified ovarian cyst, unspecified side: Secondary | ICD-10-CM

## 2018-07-11 MED ORDER — BREAST PUMP MISC: 0 refills | Status: DC

## 2018-07-11 NOTE — Patient Instructions (Signed)
Braxton Hicks Contractions Contractions of the uterus can occur throughout pregnancy, but they are not always a sign that you are in labor. You may have practice contractions called Braxton Hicks contractions. These false labor contractions are sometimes confused with true labor. What are Braxton Hicks contractions? Braxton Hicks contractions are tightening movements that occur in the muscles of the uterus before labor. Unlike true labor contractions, these contractions do not result in opening (dilation) and thinning of the cervix. Toward the end of pregnancy (32-34 weeks), Braxton Hicks contractions can happen more often and may become stronger. These contractions are sometimes difficult to tell apart from true labor because they can be very uncomfortable. You should not feel embarrassed if you go to the hospital with false labor. Sometimes, the only way to tell if you are in true labor is for your health care provider to look for changes in the cervix. The health care provider will do a physical exam and may monitor your contractions. If you are not in true labor, the exam should show that your cervix is not dilating and your water has not broken. If there are no other health problems associated with your pregnancy, it is completely safe for you to be sent home with false labor. You may continue to have Braxton Hicks contractions until you go into true labor. How to tell the difference between true labor and false labor True labor  Contractions last 30-70 seconds.  Contractions become very regular.  Discomfort is usually felt in the top of the uterus, and it spreads to the lower abdomen and low back.  Contractions do not go away with walking.  Contractions usually become more intense and increase in frequency.  The cervix dilates and gets thinner. False labor  Contractions are usually shorter and not as strong as true labor contractions.  Contractions are usually irregular.  Contractions  are often felt in the front of the lower abdomen and in the groin.  Contractions may go away when you walk around or change positions while lying down.  Contractions get weaker and are shorter-lasting as time goes on.  The cervix usually does not dilate or become thin. Follow these instructions at home:   Take over-the-counter and prescription medicines only as told by your health care provider.  Keep up with your usual exercises and follow other instructions from your health care provider.  Eat and drink lightly if you think you are going into labor.  If Braxton Hicks contractions are making you uncomfortable: ? Change your position from lying down or resting to walking, or change from walking to resting. ? Sit and rest in a tub of warm water. ? Drink enough fluid to keep your urine pale yellow. Dehydration may cause these contractions. ? Do slow and deep breathing several times an hour.  Keep all follow-up prenatal visits as told by your health care provider. This is important. Contact a health care provider if:  You have a fever.  You have continuous pain in your abdomen. Get help right away if:  Your contractions become stronger, more regular, and closer together.  You have fluid leaking or gushing from your vagina.  You pass blood-tinged mucus (bloody show).  You have bleeding from your vagina.  You have low back pain that you never had before.  You feel your baby's head pushing down and causing pelvic pressure.  Your baby is not moving inside you as much as it used to. Summary  Contractions that occur before labor are   called Braxton Hicks contractions, false labor, or practice contractions.  Braxton Hicks contractions are usually shorter, weaker, farther apart, and less regular than true labor contractions. True labor contractions usually become progressively stronger and regular, and they become more frequent.  Manage discomfort from Braxton Hicks contractions  by changing position, resting in a warm bath, drinking plenty of water, or practicing deep breathing. This information is not intended to replace advice given to you by your health care provider. Make sure you discuss any questions you have with your health care provider. Document Released: 08/11/2016 Document Revised: 01/10/2017 Document Reviewed: 08/11/2016 Elsevier Interactive Patient Education  2019 Else[employer] Inc.  

## 2018-07-11 NOTE — Progress Notes (Signed)
   TELEHEALTH VIRTUAL OBSTETRICS VISIT ENCOUNTER NOTE  I connected with Christy Hooper on 07/11/18 at  9:35 AM EDT by telephone at home and verified that I am speaking with the correct person using two identifiers.   I discussed the limitations, risks, security and privacy concerns of performing an evaluation and management service by telephone and the availability of in person appointments. I also discussed with the patient that there may be a patient responsible charge related to this service. The patient expressed understanding and agreed to proceed.  Subjective:  Christy Hooper is a 18 y.o. G1P0 at [redacted]w[redacted]d being followed for ongoing prenatal care.  She is currently monitored for the following issues for this low-risk pregnancy and has Supervision of other normal pregnancy, antepartum; Mild asthma; GBS bacteriuria; and Ovarian cyst affecting pregnancy, antepartum on their problem list.  Patient reports occasional headache relieved with tylenol and no swelling in her ankles that show pitting (described by phone to client and she pressed her fingers onto her ankles).. Reports fetal movement. Denies any contractions, bleeding or leaking of fluid.   The following portions of the patient's history were reviewed and updated as appropriate: allergies, current medications, past family history, past medical history, past social history, past surgical history and problem list.   Objective:   General:  Alert, oriented and cooperative.   Mental Status: Normal mood and affect perceived. Normal judgment and thought content.  Rest of physical exam deferred due to type of encounter  Assessment and Plan:  Pregnancy: G1P0 at [redacted]w[redacted]d 1. Supervision of other normal pregnancy, antepartum Discussed signs of increasing BP.  Currently symptoms are not concerning and does not need further evaluation today.   BP cuff to be delivered to client by staff member.   - [facility] AMB BABYSCRIPTS SCHEDULE OPTIMIZATION  2. Ovarian  cyst affecting pregnancy, antepartum Scheduled follow up US to recheck ovarian cyst - [place] [facility] OB FOLLOW UP; Future  Preterm labor symptoms and general obstetric precautions including but not limited to vaginal bleeding, contractions, leaking of fluid and fetal movement were reviewed in detail with the patient.  I discussed the assessment and treatment plan with the patient. The patient was provided an opportunity to ask questions and all were answered. The patient agreed with the plan and demonstrated an understanding of the instructions. The patient was advised to call back or seek an in-person office evaluation/go to MAU at [facility] for any urgent or concerning symptoms. Please refer to After Visit Summary for other counseling recommendations.   I provided 10 minutes of non-face-to-face time during this encounter.  Return in about 4 weeks (around 08/08/2018). for in person visit for 36 week labs and for follow up ultrasound for ovarian cyst. Prescription for breast pump sent to client along with the BP cuff. Advised to sign up for breastfeeding online classes. Is doing Facetime childbirth classes.  Currie Paris, NP [facility] [employer], [facility]

## 2018-07-11 NOTE — Progress Notes (Signed)
I connected with  Christy Hooper on 07/11/18 at  9:35 AM EDT by telephone and verified that I am speaking with the correct person using two identifiers.   I discussed the limitations, risks, security and privacy concerns of performing an evaluation and management service by telephone and the availability of in person appointments. I also discussed with the patient that there may be a patient responsible charge related to this service. The patient expressed understanding and agreed to proceed.  Christy Hooper, CMA 07/11/2018  9:51 AM   BP Cuff/ Baby RX information explained and will pick up today.

## 2018-07-16 ENCOUNTER — Other Ambulatory Visit: Payer: Self-pay | Admitting: *Deleted

## 2018-07-16 DIAGNOSIS — B379 Candidiasis, unspecified: Secondary | ICD-10-CM

## 2018-07-16 MED ORDER — TERCONAZOLE 0.4 % VA CREA: 1.0000 | TOPICAL_CREAM | Freq: Every day | VAGINAL | 0 refills | Status: DC

## 2018-08-06 ENCOUNTER — Encounter (HOSPITAL_COMMUNITY): Payer: Self-pay | Admitting: *Deleted

## 2018-08-06 ENCOUNTER — Other Ambulatory Visit: Payer: Self-pay

## 2018-08-06 ENCOUNTER — Inpatient Hospital Stay (HOSPITAL_COMMUNITY)
Admission: AD | Admit: 2018-08-06 | Discharge: 2018-08-06 | Disposition: A | Discharge status: Home / Self Care | Payer: Medicaid Other | Attending: Obstetrics and Gynecology | Admitting: Obstetrics and Gynecology

## 2018-08-06 DIAGNOSIS — O4703 False labor before 37 completed weeks of gestation, third trimester: Secondary | ICD-10-CM | POA: Diagnosis not present

## 2018-08-06 DIAGNOSIS — O479 False labor, unspecified: Secondary | ICD-10-CM | POA: Diagnosis not present

## 2018-08-06 DIAGNOSIS — Z3A36 36 weeks gestation of pregnancy: Secondary | ICD-10-CM

## 2018-08-06 NOTE — MAU Note (Signed)
Pt C/O spotting since yesterday, started having pain around 0900 in lower abdomen.  Pt noted small amount of ? Leaking yesterday, none today.  Reports good fetal movement.

## 2018-08-06 NOTE — Discharge Instructions (Signed)
Braxton Hicks Contractions Contractions of the uterus can occur throughout pregnancy, but they are not always a sign that you are in labor. You may have practice contractions called Braxton Hicks contractions. These false labor contractions are sometimes confused with true labor. What are Braxton Hicks contractions? Braxton Hicks contractions are tightening movements that occur in the muscles of the uterus before labor. Unlike true labor contractions, these contractions do not result in opening (dilation) and thinning of the cervix. Toward the end of pregnancy (32-34 weeks), Braxton Hicks contractions can happen more often and may become stronger. These contractions are sometimes difficult to tell apart from true labor because they can be very uncomfortable. You should not feel embarrassed if you go to the hospital with false labor. Sometimes, the only way to tell if you are in true labor is for your health care provider to look for changes in the cervix. The health care provider will do a physical exam and may monitor your contractions. If you are not in true labor, the exam should show that your cervix is not dilating and your water has not broken. If there are no other health problems associated with your pregnancy, it is completely safe for you to be sent home with false labor. You may continue to have Braxton Hicks contractions until you go into true labor. How to tell the difference between true labor and false labor True labor  Contractions last 30-70 seconds.  Contractions become very regular.  Discomfort is usually felt in the top of the uterus, and it spreads to the lower abdomen and low back.  Contractions do not go away with walking.  Contractions usually become more intense and increase in frequency.  The cervix dilates and gets thinner. False labor  Contractions are usually shorter and not as strong as true labor contractions.  Contractions are usually irregular.  Contractions  are often felt in the front of the lower abdomen and in the groin.  Contractions may go away when you walk around or change positions while lying down.  Contractions get weaker and are shorter-lasting as time goes on.  The cervix usually does not dilate or become thin. Follow these instructions at home:   Take over-the-counter and prescription medicines only as told by your health care provider.  Keep up with your usual exercises and follow other instructions from your health care provider.  Eat and drink lightly if you think you are going into labor.  If Braxton Hicks contractions are making you uncomfortable: ? Change your position from lying down or resting to walking, or change from walking to resting. ? Sit and rest in a tub of warm water. ? Drink enough fluid to keep your urine pale yellow. Dehydration may cause these contractions. ? Do slow and deep breathing several times an hour.  Keep all follow-up prenatal visits as told by your health care provider. This is important. Contact a health care provider if:  You have a fever.  You have continuous pain in your abdomen. Get help right away if:  Your contractions become stronger, more regular, and closer together.  You have fluid leaking or gushing from your vagina.  You pass blood-tinged mucus (bloody show).  You have bleeding from your vagina.  You have low back pain that you never had before.  You feel your baby's head pushing down and causing pelvic pressure.  Your baby is not moving inside you as much as it used to. Summary  Contractions that occur before labor are   called Braxton Hicks contractions, false labor, or practice contractions.  Braxton Hicks contractions are usually shorter, weaker, farther apart, and less regular than true labor contractions. True labor contractions usually become progressively stronger and regular, and they become more frequent.  Manage discomfort from Braxton Hicks contractions  by changing position, resting in a warm bath, drinking plenty of water, or practicing deep breathing. This information is not intended to replace advice given to you by your health care provider. Make sure you discuss any questions you have with your health care provider. Document Released: 08/11/2016 Document Revised: 01/10/2017 Document Reviewed: 08/11/2016 Elsevier Interactive Patient Education  2019 Elsevier Inc.  

## 2018-08-06 NOTE — MAU Provider Note (Signed)
Ms. Christy Hooper is a G1P0 at [redacted]w[redacted]d seen in MAU for labor. RN labor check, not seen by provider. SVE by RN Dilation: Closed Effacement (%): Thick Cervical Position: Middle Station: -2 Exam by:: Dorrene German RN   NST - FHR: 135 bpm / moderate variability / accels present / decels absent / TOCO: UI noted  Plan:  D/C home with labor precautions Keep scheduled appt with [facility] on Wednesday 08/08/2018  Raelyn Mora, CNM  08/06/2018 4:14 PM

## 2018-08-08 ENCOUNTER — Other Ambulatory Visit (HOSPITAL_COMMUNITY)
Admission: RE | Admit: 2018-08-08 | Discharge: 2018-08-08 | Disposition: A | Discharge status: Home / Self Care | Payer: BLUE CROSS/BLUE SHIELD | Source: Ambulatory Visit | Attending: Medical | Admitting: Medical

## 2018-08-08 ENCOUNTER — Other Ambulatory Visit: Payer: Self-pay

## 2018-08-08 ENCOUNTER — Ambulatory Visit (INDEPENDENT_AMBULATORY_CARE_PROVIDER_SITE_OTHER): Payer: Medicaid Other | Admitting: Medical

## 2018-08-08 VITALS — BP 117/57 | HR 93 | Wt 161.0 lb

## 2018-08-08 DIAGNOSIS — Z3480 Encounter for supervision of other normal pregnancy, unspecified trimester: Secondary | ICD-10-CM

## 2018-08-08 DIAGNOSIS — B373 Candidiasis of vulva and vagina: Secondary | ICD-10-CM

## 2018-08-08 DIAGNOSIS — L299 Pruritus, unspecified: Secondary | ICD-10-CM

## 2018-08-08 DIAGNOSIS — Z3A36 36 weeks gestation of pregnancy: Secondary | ICD-10-CM

## 2018-08-08 DIAGNOSIS — O26893 Other specified pregnancy related conditions, third trimester: Secondary | ICD-10-CM

## 2018-08-08 DIAGNOSIS — B3731 Acute candidiasis of vulva and vagina: Secondary | ICD-10-CM

## 2018-08-08 DIAGNOSIS — N83209 Unspecified ovarian cyst, unspecified side: Secondary | ICD-10-CM

## 2018-08-08 DIAGNOSIS — O3480 Maternal care for other abnormalities of pelvic organs, unspecified trimester: Secondary | ICD-10-CM

## 2018-08-08 DIAGNOSIS — O98813 Other maternal infectious and parasitic diseases complicating pregnancy, third trimester: Secondary | ICD-10-CM

## 2018-08-08 DIAGNOSIS — R8271 Bacteriuria: Secondary | ICD-10-CM

## 2018-08-08 MED ORDER — TERCONAZOLE 0.8 % VA CREA: 1.0000 | TOPICAL_CREAM | Freq: Every day | VAGINAL | 0 refills | Status: DC

## 2018-08-08 MED ORDER — TRIAMCINOLONE ACETONIDE 0.1 % EX CREA: 1.0000 "application " | TOPICAL_CREAM | Freq: Two times a day (BID) | CUTANEOUS | 0 refills | Status: DC

## 2018-08-08 NOTE — Progress Notes (Signed)
   PRENATAL VISIT NOTE  Subjective:  Christy Hooper is a 18 y.o. G1P0 at [redacted]w[redacted]d being seen today for ongoing prenatal care.  She is currently monitored for the following issues for this low-risk pregnancy and has Supervision of other normal pregnancy, antepartum; Mild asthma; GBS bacteriuria; and Ovarian cyst affecting pregnancy, antepartum on their problem list.  Patient reports vaginal irritation and itching of breast.  Contractions: Not present. Vag. Bleeding: None.  Movement: Present. Denies leaking of fluid.   The following portions of the patient's history were reviewed and updated as appropriate: allergies, current medications, past family history, past medical history, past social history, past surgical history and problem list.   Objective:   Vitals:   08/08/18 0847  BP: (!) 117/57  Pulse: 93  Weight: 161 lb (73 kg)    Fetal Status: Fetal Heart Rate (bpm): 138 Fundal Height: 37 cm Movement: Present  Presentation: Vertex  General:  Alert, oriented and cooperative. Patient is in no acute distress.  Skin: Skin is warm and dry. No rash noted.   Cardiovascular: Normal heart rate noted  Respiratory: Normal respiratory effort, no problems with respiration noted  Abdomen: Soft, gravid, appropriate for gestational age.  Pain/Pressure: Present     Pelvic: Cervical exam performed Dilation: Closed Effacement (%): 50 Station: -3  Extremities: Normal range of motion.  Edema: Trace  Mental Status: Normal mood and affect. Normal behavior. Normal judgment and thought content.   Assessment and Plan:  Pregnancy: G1P0 at [redacted]w[redacted]d 1. Supervision of other normal pregnancy, antepartum - GC/Chlamydia probe amp ([place])not at [facility]  2. GBS bacteriuria - in urine earlier in pregnancy, treat in labor  3. Ovarian cyst affecting pregnancy, antepartum - re-eval  4. Itching - triamcinolone cream (KENALOG) 0.1 %; Apply 1 application topically 2 (two) times daily.  Dispense: 30 g; Refill: 0  5.  Yeast vaginitis - terconazole (TERAZOL 3) 0.8 % vaginal cream; Place 1 applicator vaginally at bedtime.  Dispense: 20 g; Refill: 0  Preterm labor symptoms and general obstetric precautions including but not limited to vaginal bleeding, contractions, leaking of fluid and fetal movement were reviewed in detail with the patient. Please refer to After Visit Summary for other counseling recommendations.   Return in about 2 weeks (around 08/22/2018) for Virtual, LOB.  No future appointments.  Vonzella Nipple, PA-C

## 2018-08-08 NOTE — Patient Instructions (Addendum)
Fetal Movement Counts Patient Name: ________________________________________________ Patient Due Date: ____________________ What is a fetal movement count?  A fetal movement count is the number of times that you feel your baby move during a certain amount of time. This may also be called a fetal kick count. A fetal movement count is recommended for every pregnant woman. You may be asked to start counting fetal movements as early as week 28 of your pregnancy. Pay attention to when your baby is most active. You may notice your baby's sleep and wake cycles. You may also notice things that make your baby move more. You should do a fetal movement count:  When your baby is normally most active.  At the same time each day. A good time to count movements is while you are resting, after having something to eat and drink. How do I count fetal movements? 1. Find a quiet, comfortable area. Sit, or lie down on your side. 2. Write down the date, the start time and stop time, and the number of movements that you felt between those two times. Take this information with you to your health care visits. 3. For 2 hours, count kicks, flutters, swishes, rolls, and jabs. You should feel at least 10 movements during 2 hours. 4. You may stop counting after you have felt 10 movements. 5. If you do not feel 10 movements in 2 hours, have something to eat and drink. Then, keep resting and counting for 1 hour. If you feel at least 4 movements during that hour, you may stop counting. Contact a health care provider if:  You feel fewer than 4 movements in 2 hours.  Your baby is not moving like he or she usually does. Date: ____________ Start time: ____________ Stop time: ____________ Movements: ____________ Date: ____________ Start time: ____________ Stop time: ____________ Movements: ____________ Date: ____________ Start time: ____________ Stop time: ____________ Movements: ____________ Date: ____________ Start time:  ____________ Stop time: ____________ Movements: ____________ Date: ____________ Start time: ____________ Stop time: ____________ Movements: ____________ Date: ____________ Start time: ____________ Stop time: ____________ Movements: ____________ Date: ____________ Start time: ____________ Stop time: ____________ Movements: ____________ Date: ____________ Start time: ____________ Stop time: ____________ Movements: ____________ Date: ____________ Start time: ____________ Stop time: ____________ Movements: ____________ This information is not intended to replace advice given to you by your health care provider. Make sure you discuss any questions you have with your health care provider. Document Released: 04/27/2006 Document Revised: 11/25/2015 Document Reviewed: 05/07/2015 Elsevier Interactive Patient Education  2019 [employer] Braxton Hicks Contractions Contractions of the uterus can occur throughout pregnancy, but they are not always a sign that you are in labor. You may have practice contractions called Braxton Hicks contractions. These false labor contractions are sometimes confused with true labor. What are Braxton Hicks contractions? Braxton Hicks contractions are tightening movements that occur in the muscles of the uterus before labor. Unlike true labor contractions, these contractions do not result in opening (dilation) and thinning of the cervix. Toward the end of pregnancy (32-34 weeks), Braxton Hicks contractions can happen more often and may become stronger. These contractions are sometimes difficult to tell apart from true labor because they can be very uncomfortable. You should not feel embarrassed if you go to the hospital with false labor. Sometimes, the only way to tell if you are in true labor is for your health care provider to look for changes in the cervix. The health care provider will do a physical exam and may monitor your contractions. If   you are not in true labor, the exam  should show that your cervix is not dilating and your water has not broken. If there are no other health problems associated with your pregnancy, it is completely safe for you to be sent home with false labor. You may continue to have Braxton Hicks contractions until you go into true labor. How to tell the difference between true labor and false labor True labor  Contractions last 30-70 seconds.  Contractions become very regular.  Discomfort is usually felt in the top of the uterus, and it spreads to the lower abdomen and low back.  Contractions do not go away with walking.  Contractions usually become more intense and increase in frequency.  The cervix dilates and gets thinner. False labor  Contractions are usually shorter and not as strong as true labor contractions.  Contractions are usually irregular.  Contractions are often felt in the front of the lower abdomen and in the groin.  Contractions may go away when you walk around or change positions while lying down.  Contractions get weaker and are shorter-lasting as time goes on.  The cervix usually does not dilate or become thin. Follow these instructions at home:   Take over-the-counter and prescription medicines only as told by your health care provider.  Keep up with your usual exercises and follow other instructions from your health care provider.  Eat and drink lightly if you think you are going into labor.  If Braxton Hicks contractions are making you uncomfortable: ? Change your position from lying down or resting to walking, or change from walking to resting. ? Sit and rest in a tub of warm water. ? Drink enough fluid to keep your urine pale yellow. Dehydration may cause these contractions. ? Do slow and deep breathing several times an hour.  Keep all follow-up prenatal visits as told by your health care provider. This is important. Contact a health care provider if:  You have a fever.  You have continuous  pain in your abdomen. Get help right away if:  Your contractions become stronger, more regular, and closer together.  You have fluid leaking or gushing from your vagina.  You pass blood-tinged mucus (bloody show).  You have bleeding from your vagina.  You have low back pain that you never had before.  You feel your baby's head pushing down and causing pelvic pressure.  Your baby is not moving inside you as much as it used to. Summary  Contractions that occur before labor are called Braxton Hicks contractions, false labor, or practice contractions.  Braxton Hicks contractions are usually shorter, weaker, farther apart, and less regular than true labor contractions. True labor contractions usually become progressively stronger and regular, and they become more frequent.  Manage discomfort from [facility] contractions by changing position, resting in a warm bath, drinking plenty of water, or practicing deep breathing. This information is not intended to replace advice given to you by your health care provider. Make sure you discuss any questions you have with your health care provider. Document Released: 08/11/2016 Document Revised: 01/10/2017 Document Reviewed: 08/11/2016 Elsevier Interactive Patient Education  2019 [employer] Inc[employer]  Vaginal Yeast infection, Adult  Vaginal yeast infection is a condition that causes vaginal discharge as well as soreness, swelling, and redness (inflammation) of the vagina. This is a common condition. Some women get this infection frequently. What are the causes? This condition is caused by a change in the normal balance of the yeast (candida) and bacteria  that live in the vagina. This change causes an overgrowth of yeast, which causes the inflammation. What increases the risk? The condition is more likely to develop in women who:  Take antibiotic medicines.  Have diabetes.  Take birth control pills.  Are pregnant.  Douche often.  Have a weak  body defense system (immune system).  Have been taking steroid medicines for a long time.  Frequently wear tight clothing. What are the signs or symptoms? Symptoms of this condition include:  White, thick, creamy vaginal discharge.  Swelling, itching, redness, and irritation of the vagina. The lips of the vagina (vulva) may be affected as well.  Pain or a burning feeling while urinating.  Pain during sex. How is this diagnosed? This condition is diagnosed based on:  Your medical history.  A physical exam.  A pelvic exam. Your health care provider will examine a sample of your vaginal discharge under a microscope. Your health care provider may send this sample for testing to confirm the diagnosis. How is this treated? This condition is treated with medicine. Medicines may be over-the-counter or prescription. You may be told to use one or more of the following:  Medicine that is taken by mouth (orally).  Medicine that is applied as a cream (topically).  Medicine that is inserted directly into the vagina (suppository). Follow these instructions at home:  Lifestyle  Do not have sex until your health care provider approves. Tell your sex partner that you have a yeast infection. That person should go to his or her health care provider and ask if they should also be treated.  Do not wear tight clothes, such as pantyhose or tight pants.  Wear breathable cotton underwear. General instructions  Take or apply over-the-counter and prescription medicines only as told by your health care provider.  Eat more yogurt. This may help to keep your yeast infection from returning.  Do not use tampons until your health care provider approves.  Try taking a sitz bath to help with discomfort. This is a warm water bath that is taken while you are sitting down. The water should only come up to your hips and should cover your buttocks. Do this 3-4 times per day or as told by your health care  provider.  Do not douche.  If you have diabetes, keep your blood sugar levels under control.  Keep all follow-up visits as told by your health care provider. This is important. Contact a health care provider if:  You have a fever.  Your symptoms go away and then return.  Your symptoms do not get better with treatment.  Your symptoms get worse.  You have new symptoms.  You develop blisters in or around your vagina.  You have blood coming from your vagina and it is not your menstrual period.  You develop pain in your abdomen. Summary  Vaginal yeast infection is a condition that causes discharge as well as soreness, swelling, and redness (inflammation) of the vagina.  This condition is treated with medicine. Medicines may be over-the-counter or prescription.  Take or apply over-the-counter and prescription medicines only as told by your health care provider.  Do not douche. Do not have sex or use tampons until your health care provider approves.  Contact a health care provider if your symptoms do not get better with treatment or your symptoms go away and then return. This information is not intended to replace advice given to you by your health care provider. Make sure you discuss any  questions you have with your health care provider. Document Released: 01/05/2005 Document Revised: 08/14/2017 Document Reviewed: 08/14/2017 Elsevier Interactive Patient Education  2019 [employer]

## 2018-08-09 LAB — GC/CHLAMYDIA PROBE AMP (~~LOC~~) NOT AT ARMC
Chlamydia: NEGATIVE
Neisseria Gonorrhea: NEGATIVE

## 2018-08-10 ENCOUNTER — Inpatient Hospital Stay (HOSPITAL_COMMUNITY)
Admission: AD | Admit: 2018-08-10 | Discharge: 2018-08-10 | Disposition: A | Discharge status: Home / Self Care | Payer: Medicaid Other | Attending: Obstetrics and Gynecology | Admitting: Obstetrics and Gynecology

## 2018-08-10 ENCOUNTER — Encounter (HOSPITAL_COMMUNITY): Payer: Self-pay

## 2018-08-10 ENCOUNTER — Other Ambulatory Visit: Payer: Self-pay

## 2018-08-10 DIAGNOSIS — Z3A37 37 weeks gestation of pregnancy: Secondary | ICD-10-CM | POA: Diagnosis not present

## 2018-08-10 DIAGNOSIS — O479 False labor, unspecified: Secondary | ICD-10-CM | POA: Diagnosis not present

## 2018-08-10 DIAGNOSIS — O471 False labor at or after 37 completed weeks of gestation: Secondary | ICD-10-CM | POA: Diagnosis not present

## 2018-08-10 LAB — URINALYSIS, ROUTINE W REFLEX MICROSCOPIC
Bilirubin Urine: NEGATIVE
Glucose, UA: NEGATIVE mg/dL
Ketones, ur: NEGATIVE mg/dL
Nitrite: NEGATIVE
Protein, ur: NEGATIVE mg/dL
Specific Gravity, Urine: 1.004 — ABNORMAL LOW (ref 1.005–1.030)
pH: 7 (ref 5.0–8.0)

## 2018-08-10 NOTE — Discharge Instructions (Signed)
Fetal Movement Counts °Patient Name: ________________________________________________ Patient Due Date: ____________________ °What is a fetal movement count? ° °A fetal movement count is the number of times that you feel your baby move during a certain amount of time. This may also be called a fetal kick count. A fetal movement count is recommended for every pregnant woman. You may be asked to start counting fetal movements as early as week 28 of your pregnancy. °Pay attention to when your baby is most active. You may notice your baby's sleep and wake cycles. You may also notice things that make your baby move more. You should do a fetal movement count: °· When your baby is normally most active. °· At the same time each day. °A good time to count movements is while you are resting, after having something to eat and drink. °How do I count fetal movements? °1. Find a quiet, comfortable area. Sit, or lie down on your side. °2. Write down the date, the start time and stop time, and the number of movements that you felt between those two times. Take this information with you to your health care visits. °3. For 2 hours, count kicks, flutters, swishes, rolls, and jabs. You should feel at least 10 movements during 2 hours. °4. You may stop counting after you have felt 10 movements. °5. If you do not feel 10 movements in 2 hours, have something to eat and drink. Then, keep resting and counting for 1 hour. If you feel at least 4 movements during that hour, you may stop counting. °Contact a health care provider if: °· You feel fewer than 4 movements in 2 hours. °· Your baby is not moving like he or she usually does. °Date: ____________ Start time: ____________ Stop time: ____________ Movements: ____________ °Date: ____________ Start time: ____________ Stop time: ____________ Movements: ____________ °Date: ____________ Start time: ____________ Stop time: ____________ Movements: ____________ °Date: ____________ Start time:  ____________ Stop time: ____________ Movements: ____________ °Date: ____________ Start time: ____________ Stop time: ____________ Movements: ____________ °Date: ____________ Start time: ____________ Stop time: ____________ Movements: ____________ °Date: ____________ Start time: ____________ Stop time: ____________ Movements: ____________ °Date: ____________ Start time: ____________ Stop time: ____________ Movements: ____________ °Date: ____________ Start time: ____________ Stop time: ____________ Movements: ____________ °This information is not intended to replace advice given to you by your health care provider. Make sure you discuss any questions you have with your health care provider. °Document Released: 04/27/2006 Document Revised: 11/25/2015 Document Reviewed: 05/07/2015 °Elsevier Interactive Patient Education © 2019 [employer] Inc. °Signs and Symptoms of Labor °Labor is your body's natural process of moving your baby, placenta, and umbilical cord out of your uterus. The process of labor usually starts when your baby is full-term, between 37 and 40 weeks of pregnancy. °How will I know when I am close to going into labor? °As your body prepares for labor and the birth of your baby, you may notice the following symptoms in the weeks and days before true labor starts: °· Having a strong desire to get your home ready to receive your new baby. This is called nesting. Nesting may be a sign that labor is approaching, and it may occur several weeks before birth. Nesting may involve cleaning and organizing your home. °· Passing a small amount of thick, bloody mucus out of your vagina (normal bloody show or losing your mucus plug). This may happen more than a week before labor begins, or it might occur right before labor begins as the opening of the cervix   starts to widen (dilate). For some women, the entire mucus plug passes at once. For others, smaller portions of the mucus plug may gradually pass over several  days. °· Your baby moving (dropping) lower in your pelvis to get into position for birth (lightening). When this happens, you may feel more pressure on your bladder and pelvic bone and less pressure on your ribs. This may make it easier to breathe. It may also cause you to need to urinate more often and have problems with bowel movements. °· Having "practice contractions" (Braxton Hicks contractions) that occur at irregular (unevenly spaced) intervals that are more than 10 minutes apart. This is also called false labor. False labor contractions are common after exercise or sexual activity, and they will stop if you change position, rest, or drink fluids. These contractions are usually mild and do not get stronger over time. They may feel like: °? A backache or back pain. °? Mild cramps, similar to menstrual cramps. °? Tightening or pressure in your abdomen. °Other early symptoms that labor may be starting soon include: °· Nausea or loss of appetite. °· Diarrhea. °· Having a sudden burst of energy, or feeling very tired. °· Mood changes. °· Having trouble sleeping. °How will I know when labor has begun? °Signs that true labor has begun may include: °· Having contractions that come at regular (evenly spaced) intervals and increase in intensity. This may feel like more intense tightening or pressure in your abdomen that moves to your back. °? Contractions may also feel like rhythmic pain in your upper thighs or back that comes and goes at regular intervals. °? For first-time mothers, this change in intensity of contractions often occurs at a more gradual pace. °? Women who have given birth before may notice a more rapid progression of contraction changes. °· Having a feeling of pressure in the vaginal area. °· Your water breaking (rupture of membranes). This is when the sac of fluid that surrounds your baby breaks. When this happens, you will notice fluid leaking from your vagina. This may be clear or blood-tinged.  Labor usually starts within 24 hours of your water breaking, but it may take longer to begin. °? Some women notice this as a gush of fluid. °? Others notice that their underwear repeatedly becomes damp. °Follow these instructions at home: ° °· When labor starts, or if your water breaks, call your health care provider or nurse care line. Based on your situation, they will determine when you should go in for an exam. °· When you are in early labor, you may be able to rest and manage symptoms at home. Some strategies to try at home include: °? Breathing and relaxation techniques. °? Taking a warm bath or shower. °? Listening to music. °? Using a heating pad on the lower back for pain. If you are directed to use heat: °§ Place a towel between your skin and the heat source. °§ Leave the heat on for 20-30 minutes. °§ Remove the heat if your skin turns bright red. This is especially important if you are unable to feel pain, heat, or cold. You may have a greater risk of getting burned. °Get help right away if: °· You have painful, regular contractions that are 5 minutes apart or less. °· Labor starts before you are [redacted] weeks along in your pregnancy. °· You have a fever. °· You have a headache that does not go away. °· You have bright red blood coming from your vagina. °·   You do not feel your baby moving. °· You have a sudden onset of: °? Severe headache with vision problems. °? Nausea, vomiting, or diarrhea. °? Chest pain or shortness of breath. °These symptoms may be an emergency. If your health care provider recommends that you go to the hospital or birth center where you plan to deliver, do not drive yourself. Have someone else drive you, or call emergency services (911 in the [place] °Summary °· Labor is your body's natural process of moving your baby, placenta, and umbilical cord out of your uterus. °· The process of labor usually starts when your baby is full-term, between 37 and 40 weeks of pregnancy. °· When labor  starts, or if your water breaks, call your health care provider or nurse care line. Based on your situation, they will determine when you should go in for an exam. °This information is not intended to replace advice given to you by your health care provider. Make sure you discuss any questions you have with your health care provider. °Document Released: 09/02/2016 Document Revised: 09/02/2016 Document Reviewed: 09/02/2016 °Elsevier Interactive Patient Education © 2019 Elsevier Inc. ° °

## 2018-08-10 NOTE — MAU Note (Signed)
Pt noticed bleeding today when she wiped, was told to get checked. When she got to the lobby to sit down she said she's seeing white spots in her vision. Minimal pain. +FM No LOF. BP was good at home.

## 2018-08-10 NOTE — MAU Provider Note (Signed)
S: Patient is here for RN labor evaluation. Strip, vital signs, & chart Reviewed   O:  Vitals:   08/10/18 1627 08/10/18 1641 08/10/18 1645  BP: 107/70 108/68   Pulse: 97 94   Resp: 18 17   Temp: 98.3 F (36.8 C)    TempSrc: Oral    SpO2: 100%  100%  Weight: 74.8 kg     Results for orders placed or performed during the hospital encounter of 08/10/18 (from the past 24 hour(s))  Urinalysis, Routine w reflex microscopic     Status: Abnormal   Collection Time: 08/10/18  4:48 PM  Result Value Ref Range   Color, Urine YELLOW YELLOW   APPearance HAZY (A) CLEAR   Specific Gravity, Urine 1.004 (L) 1.005 - 1.030   pH 7.0 5.0 - 8.0   Glucose, UA NEGATIVE NEGATIVE mg/dL   Hgb urine dipstick MODERATE (A) NEGATIVE   Bilirubin Urine NEGATIVE NEGATIVE   Ketones, ur NEGATIVE NEGATIVE mg/dL   Protein, ur NEGATIVE NEGATIVE mg/dL   Nitrite NEGATIVE NEGATIVE   Leukocytes,Ua TRACE (A) NEGATIVE   RBC / HPF 0-5 0 - 5 RBC/hpf   WBC, UA 0-5 0 - 5 WBC/hpf   Bacteria, UA RARE (A) NONE SEEN   Squamous Epithelial / LPF 6-10 0 - 5    Dilation: Closed Effacement (%): 50 Cervical Position: Middle Station: -3 Exam by:: CWhite,RNC   FHR: 135 bpm, Mod Var, No Decels, 15x15 Accels UC: irregular ctx   A: 1. False labor   2. [redacted] weeks gestation of pregnancy      P:  RN to discharge home in stable condition with return precautions & fetal kick counts  Judeth Horn FNP 6:08 PM

## 2018-08-10 NOTE — MAU Note (Signed)
I have communicated with E. Lawrence,NP and reviewed vital signs:  Vitals:   08/10/18 1641 08/10/18 1645  BP: 108/68   Pulse: 94   Resp: 17   Temp:    SpO2:  100%    Vaginal exam:  Dilation: Closed Effacement (%): 50 Cervical Position: Middle Station: -3 Exam by:: CWhite,RNC,   Also reviewed contraction pattern and that non-stress test is reactive.  It has been documented that patient is contracting irregularly with no cervical change over 1 hour not indicating active labor.  Patient denies any other complaints.  Based on this report provider has given order for discharge.  A discharge order and diagnosis entered by a provider.   Labor discharge instructions reviewed with patient.

## 2018-08-15 ENCOUNTER — Ambulatory Visit (INDEPENDENT_AMBULATORY_CARE_PROVIDER_SITE_OTHER): Payer: Medicaid Other

## 2018-08-15 DIAGNOSIS — Z3480 Encounter for supervision of other normal pregnancy, unspecified trimester: Secondary | ICD-10-CM

## 2018-08-15 DIAGNOSIS — Z3483 Encounter for supervision of other normal pregnancy, third trimester: Secondary | ICD-10-CM

## 2018-08-15 DIAGNOSIS — Z3A37 37 weeks gestation of pregnancy: Secondary | ICD-10-CM

## 2018-08-15 NOTE — Progress Notes (Signed)
   TELEHEALTH VIRTUAL OBSTETRICS VISIT ENCOUNTER NOTE  I connected with Christy Hooper on 08/15/18 at 10:55 AM EDT by WebEx at home and verified that I am speaking with the correct person using two identifiers.   I discussed the limitations, risks, security and privacy concerns of performing an evaluation and management service by telephone and the availability of in person appointments. I also discussed with the patient that there may be a patient responsible charge related to this service. The patient expressed understanding and agreed to proceed.  Subjective:  Christy Hooper is a 18 y.o. G1P0 at [redacted]w[redacted]d being followed for ongoing prenatal care.  She is currently monitored for the following issues for this low-risk pregnancy and has Supervision of other normal pregnancy, antepartum; Mild asthma; GBS bacteriuria; and Ovarian cyst affecting pregnancy, antepartum on their problem list.  Patient reports no complaints. Reports fetal movement. Denies any contractions, bleeding or leaking of fluid.   The following portions of the patient's history were reviewed and updated as appropriate: allergies, current medications, past family history, past medical history, past social history, past surgical history and problem list.   Objective:   General:  Alert, oriented and cooperative.   Mental Status: Normal mood and affect perceived. Normal judgment and thought content.  Rest of physical exam deferred due to type of encounter  Assessment and Plan:  Pregnancy: G1P0 at [redacted]w[redacted]d 1. Supervision of other normal pregnancy, antepartum -Patient on break at work and unable to check BP right now. Will check today and log in babyRx -Breastfeeding questions answered.  Term labor symptoms and general obstetric precautions including but not limited to vaginal bleeding, contractions, leaking of fluid and fetal movement were reviewed in detail with the patient.  I discussed the assessment and treatment plan with the  patient. The patient was provided an opportunity to ask questions and all were answered. The patient agreed with the plan and demonstrated an understanding of the instructions. The patient was advised to call back or seek an in-person office evaluation/go to MAU at [facility] for any urgent or concerning symptoms. Please refer to After Visit Summary for other counseling recommendations.   I provided 10 minutes of non-face-to-face time during this encounter.  Return in about 1 week (around 08/22/2018) for Return OB visit.  Future Appointments  Date Time Provider Department Center  08/22/2018  9:35 AM Kathlene Cote [facility] WOC  08/29/2018  9:35 AM Rolm Bookbinder, CNM [facility] WOC  09/04/2018  2:15 PM Kooistra, Charlesetta Garibaldi, CNM [facility] WOC    Rolm Bookbinder, CNM Center for [employer], [facility]

## 2018-08-21 ENCOUNTER — Other Ambulatory Visit (HOSPITAL_COMMUNITY)
Admission: RE | Admit: 2018-08-21 | Discharge: 2018-08-21 | Disposition: A | Discharge status: Home / Self Care | Payer: BLUE CROSS/BLUE SHIELD | Source: Ambulatory Visit | Attending: Family Medicine | Admitting: Family Medicine

## 2018-08-21 ENCOUNTER — Ambulatory Visit (INDEPENDENT_AMBULATORY_CARE_PROVIDER_SITE_OTHER): Payer: Medicaid Other | Admitting: General Practice

## 2018-08-21 ENCOUNTER — Other Ambulatory Visit: Payer: Self-pay

## 2018-08-21 DIAGNOSIS — N898 Other specified noninflammatory disorders of vagina: Secondary | ICD-10-CM

## 2018-08-21 NOTE — Progress Notes (Signed)
I have reviewed the chart and agree with nursing staff's documentation of this patient's encounter.  Vonzella Nipple, PA-C 08/21/2018 11:57 AM

## 2018-08-21 NOTE — Progress Notes (Signed)
Patient presents to office today reporting continued vaginal discharge despite recently taking medication for a yeast infection. Patient reports yellowish discharge- denies irritation or odor. Patient instructed in self swab and specimen collected. Discussed hopefully results will be back in time for her appt tomorrow & the provider can review them with her. Patient verbalized understanding & had no questions.  Chase Caller RN BSN 08/21/18

## 2018-08-22 ENCOUNTER — Ambulatory Visit (INDEPENDENT_AMBULATORY_CARE_PROVIDER_SITE_OTHER): Payer: BC Managed Care – PPO | Admitting: Obstetrics & Gynecology

## 2018-08-22 DIAGNOSIS — Z3A38 38 weeks gestation of pregnancy: Secondary | ICD-10-CM

## 2018-08-22 DIAGNOSIS — Z3480 Encounter for supervision of other normal pregnancy, unspecified trimester: Secondary | ICD-10-CM

## 2018-08-22 DIAGNOSIS — R8271 Bacteriuria: Secondary | ICD-10-CM

## 2018-08-22 DIAGNOSIS — O98513 Other viral diseases complicating pregnancy, third trimester: Secondary | ICD-10-CM

## 2018-08-22 LAB — CERVICOVAGINAL ANCILLARY ONLY
Chlamydia: NEGATIVE
Neisseria Gonorrhea: NEGATIVE
Trichomonas: NEGATIVE

## 2018-08-22 NOTE — Progress Notes (Signed)
   TELEHEALTH VIRTUAL OBSTETRICS PRENATAL VISIT ENCOUNTER NOTE  I connected with Christy Hooper on 08/22/18 at  9:35 AM EDT by WebEx at home and verified that I am speaking with the correct person using two identifiers.   I discussed the limitations, risks, security and privacy concerns of performing an evaluation and management service by telephone and the availability of in person appointments. I also discussed with the patient that there may be a patient responsible charge related to this service. The patient expressed understanding and agreed to proceed. Subjective:  Christy Hooper is a 18 y.o. G1P0 at [redacted]w[redacted]d being seen today for ongoing prenatal care.  She is currently monitored for the following issues for this low-risk pregnancy and has Supervision of other normal pregnancy, antepartum; Mild asthma; GBS bacteriuria; and Ovarian cyst affecting pregnancy, antepartum on their problem list.  Patient reports no complaints.  Reports fetal movement.  .  .   . Denies any contractions, bleeding or leaking of fluid.   The following portions of the patient's history were reviewed and updated as appropriate: allergies, current medications, past family history, past medical history, past social history, past surgical history and problem list.   Objective:  There were no vitals filed for this visit.  Fetal Status:           General:  Alert, oriented and cooperative. Patient is in no acute distress.  Respiratory: Normal respiratory effort, no problems with respiration noted  Mental Status: Normal mood and affect. Normal behavior. Normal judgment and thought content.  Rest of physical exam deferred due to type of encounter  Assessment and Plan:  Pregnancy: G1P0 at [redacted]w[redacted]d 1. GBS bacteriuria -treat in labor  2. Supervision of other normal pregnancy, antepartum   Term labor symptoms and general obstetric precautions including but not limited to vaginal bleeding, contractions, leaking of fluid and fetal  movement were reviewed in detail with the patient. I discussed the assessment and treatment plan with the patient. The patient was provided an opportunity to ask questions and all were answered. The patient agreed with the plan and demonstrated an understanding of the instructions. The patient was advised to call back or seek an in-person office evaluation/go to MAU at [facility] for any urgent or concerning symptoms. Please refer to After Visit Summary for other counseling recommendations.   I provided 10 minutes of face-to-face via WebEx time during this encounter.  No follow-ups on file.  Future Appointments  Date Time Provider Department Center  08/29/2018  9:35 AM Rennie Plowman [facility] WOC  09/04/2018  2:15 PM Kooistra, Charlesetta Garibaldi, CNM [facility] W[facility]    Birgitta Uhlir Inda Coke, MD Center for [facility], [facility]

## 2018-08-29 ENCOUNTER — Other Ambulatory Visit: Payer: Self-pay

## 2018-08-29 ENCOUNTER — Ambulatory Visit (INDEPENDENT_AMBULATORY_CARE_PROVIDER_SITE_OTHER): Payer: BLUE CROSS/BLUE SHIELD | Admitting: Obstetrics and Gynecology

## 2018-08-29 ENCOUNTER — Encounter (HOSPITAL_COMMUNITY): Payer: Self-pay

## 2018-08-29 ENCOUNTER — Inpatient Hospital Stay (HOSPITAL_COMMUNITY)
Admission: AD | Admit: 2018-08-29 | Discharge: 2018-08-29 | Disposition: A | Discharge status: Home / Self Care | Payer: BC Managed Care – PPO | Attending: Family Medicine | Admitting: Family Medicine

## 2018-08-29 ENCOUNTER — Encounter: Payer: Self-pay | Admitting: Obstetrics and Gynecology

## 2018-08-29 VITALS — BP 110/66 | HR 105 | Temp 98.6°F | Wt 167.6 lb

## 2018-08-29 DIAGNOSIS — Z87891 Personal history of nicotine dependence: Secondary | ICD-10-CM | POA: Insufficient documentation

## 2018-08-29 DIAGNOSIS — N83209 Unspecified ovarian cyst, unspecified side: Secondary | ICD-10-CM

## 2018-08-29 DIAGNOSIS — O3480 Maternal care for other abnormalities of pelvic organs, unspecified trimester: Secondary | ICD-10-CM

## 2018-08-29 DIAGNOSIS — O26893 Other specified pregnancy related conditions, third trimester: Secondary | ICD-10-CM | POA: Diagnosis present

## 2018-08-29 DIAGNOSIS — R8271 Bacteriuria: Secondary | ICD-10-CM

## 2018-08-29 DIAGNOSIS — N898 Other specified noninflammatory disorders of vagina: Secondary | ICD-10-CM | POA: Insufficient documentation

## 2018-08-29 DIAGNOSIS — Z3480 Encounter for supervision of other normal pregnancy, unspecified trimester: Secondary | ICD-10-CM

## 2018-08-29 DIAGNOSIS — Z3689 Encounter for other specified antenatal screening: Secondary | ICD-10-CM | POA: Diagnosis not present

## 2018-08-29 DIAGNOSIS — Z3483 Encounter for supervision of other normal pregnancy, third trimester: Secondary | ICD-10-CM

## 2018-08-29 DIAGNOSIS — Z3A39 39 weeks gestation of pregnancy: Secondary | ICD-10-CM

## 2018-08-29 DIAGNOSIS — Z1159 Encounter for screening for other viral diseases: Secondary | ICD-10-CM | POA: Diagnosis not present

## 2018-08-29 LAB — AMNISURE RUPTURE OF MEMBRANE (ROM) NOT AT ARMC: Amnisure ROM: NEGATIVE

## 2018-08-29 NOTE — Discharge Instructions (Signed)
Braxton Hicks Contractions Contractions of the uterus can occur throughout pregnancy, but they are not always a sign that you are in labor. You may have practice contractions called Braxton Hicks contractions. These false labor contractions are sometimes confused with true labor. What are Braxton Hicks contractions? Braxton Hicks contractions are tightening movements that occur in the muscles of the uterus before labor. Unlike true labor contractions, these contractions do not result in opening (dilation) and thinning of the cervix. Toward the end of pregnancy (32-34 weeks), Braxton Hicks contractions can happen more often and may become stronger. These contractions are sometimes difficult to tell apart from true labor because they can be very uncomfortable. You should not feel embarrassed if you go to the hospital with false labor. Sometimes, the only way to tell if you are in true labor is for your health care provider to look for changes in the cervix. The health care provider will do a physical exam and may monitor your contractions. If you are not in true labor, the exam should show that your cervix is not dilating and your water has not broken. If there are no other health problems associated with your pregnancy, it is completely safe for you to be sent home with false labor. You may continue to have Braxton Hicks contractions until you go into true labor. How to tell the difference between true labor and false labor True labor  Contractions last 30-70 seconds.  Contractions become very regular.  Discomfort is usually felt in the top of the uterus, and it spreads to the lower abdomen and low back.  Contractions do not go away with walking.  Contractions usually become more intense and increase in frequency.  The cervix dilates and gets thinner. False labor  Contractions are usually shorter and not as strong as true labor contractions.  Contractions are usually irregular.  Contractions  are often felt in the front of the lower abdomen and in the groin.  Contractions may go away when you walk around or change positions while lying down.  Contractions get weaker and are shorter-lasting as time goes on.  The cervix usually does not dilate or become thin. Follow these instructions at home:   Take over-the-counter and prescription medicines only as told by your health care provider.  Keep up with your usual exercises and follow other instructions from your health care provider.  Eat and drink lightly if you think you are going into labor.  If Braxton Hicks contractions are making you uncomfortable: ? Change your position from lying down or resting to walking, or change from walking to resting. ? Sit and rest in a tub of warm water. ? Drink enough fluid to keep your urine pale yellow. Dehydration may cause these contractions. ? Do slow and deep breathing several times an hour.  Keep all follow-up prenatal visits as told by your health care provider. This is important. Contact a health care provider if:  You have a fever.  You have continuous pain in your abdomen. Get help right away if:  Your contractions become stronger, more regular, and closer together.  You have fluid leaking or gushing from your vagina.  You pass blood-tinged mucus (bloody show).  You have bleeding from your vagina.  You have low back pain that you never had before.  You feel your baby's head pushing down and causing pelvic pressure.  Your baby is not moving inside you as much as it used to. Summary  Contractions that occur before labor are   called Braxton Hicks contractions, false labor, or practice contractions.  Braxton Hicks contractions are usually shorter, weaker, farther apart, and less regular than true labor contractions. True labor contractions usually become progressively stronger and regular, and they become more frequent.  Manage discomfort from Braxton Hicks contractions  by changing position, resting in a warm bath, drinking plenty of water, or practicing deep breathing. This information is not intended to replace advice given to you by your health care provider. Make sure you discuss any questions you have with your health care provider. Document Released: 08/11/2016 Document Revised: 01/10/2017 Document Reviewed: 08/11/2016 Elsevier Interactive Patient Education  2019 Elsevier Inc.  

## 2018-08-29 NOTE — MAU Note (Signed)
Pt present to MAU with c/o PROM. Pt was at [facility] office and they did a FERN slide, sent her here for Amnisure. She has had mild abdominal pain x 2 days that comes and goes as well as light pink spotting. +FM

## 2018-08-29 NOTE — Patient Instructions (Signed)
Places to have your son circumcised:                                                                      [facility]     [phone]   $480 while you are in hospital         [facility]              [phone]   $[phone] by 4 wks                      [facility]                     [phone]   $269 by 7 days [facility]                    [phone]   $269 by 4 wks [facility]             [phone]   $175 by 2 wks    These prices sometimes change but are roughly what you can expect to pay. Please call and confirm pricing.   Circumcision is considered an elective/non-medically necessary procedure. There are many reasons parents decide to have their sons circumsized. During the first year of life circumcised males have a reduced risk of urinary tract infections but after this year the rates between circumcised males and uncircumcised males are the same.  It is safe to have your son circumcised outside of the hospital and the places above perform them regularly.   Deciding about Circumcision in Baby Boys  (Up-to-date The Basics)  What is circumcision?   Circumcision is a surgery that removes the skin that covers the tip of the penis, called the "foreskin" Circumcision is usually done when a boy is between 1 and 10 days old. In the [place], circumcision is common. In some other countries, fewer boys are circumcised. Circumcision is a common tradition in some religions.  Should I have my baby boy circumcised?   There is no easy answer. Circumcision has some benefits. But it also has risks. After talking with your doctor, you will have to decide for yourself what is right for your family.  What are the benefits of circumcision?   Circumcised boys seem to have slightly lower rates of: ?Urinary tract infections ?Swelling of the opening at the tip of the penis Circumcised men seem to have slightly lower rates of: ?Urinary tract infections ?Swelling of the opening at the tip of the penis ?Penis cancer ?HIV  and other infections that you catch during sex ?Cervical cancer in the women they have sex with Even so, in the [place], the risks of these problems are small - even in boys and men who have not been circumcised. Plus, boys and men who are not circumcised can reduce these extra risks by: ?Cleaning their penis well ?Using condoms during sex  What are the risks of circumcision?  Risks include: ?Bleeding or infection from the surgery ?Damage to or amputation of the penis ?A chance that the doctor will cut off too much or not enough of the foreskin ?A chance that sex won't feel as good later in life Only about 1 out of every 200   circumcisions leads to problems. There is also a chance that your health insurance won't pay for circumcision.  How is circumcision done in baby boys?  First, the baby gets medicine for pain relief. This might be a cream on the skin or a shot into the base of the penis. Next, the doctor cleans the baby's penis well. Then he or she uses special tools to cut off the foreskin. Finally, the doctor wraps a bandage (called gauze) around the baby's penis. If you have your baby circumcised, his doctor or nurse will give you instructions on how to care for him after the surgery. It is important that you follow those instructions carefully.  

## 2018-08-29 NOTE — MAU Provider Note (Signed)
History   [id number]   Chief Complaint  Patient presents with  . Rupture of Membranes    HPI Christy Hooper is a 18 y.o. female  G1P0 @39 .5 wks sent from office for leaking fluid x2 days. Reports seeing small amt of clear pink fluid when she wipes. Pt reports rare contractions. She reports good fetal movement. All other systems negative.    Patient's last menstrual period was 11/24/2017 (approximate).  OB History  Gravida Para Term Preterm AB Living  1            SAB TAB Ectopic Multiple Live Births               # Outcome Date GA Lbr Len/2nd Weight Sex Delivery Anes PTL Lv  1 Current             Past Medical History:  Diagnosis Date  . Asthma     Family History  Problem Relation Age of Onset  . Cancer Paternal Aunt   . Depression Maternal Grandmother   . Depression Maternal Grandfather   . Cancer Other     Social History   Socioeconomic History  . Marital status: Single    Spouse name: Not on file  . Number of children: Not on file  . Years of education: Not on file  . Highest education level: Not on file  Occupational History  . Not on file  Social Needs  . Financial resource strain: Not on file  . Food insecurity:    Worry: Never true    Inability: Never true  . Transportation needs:    Medical: No    Non-medical: No  Tobacco Use  . Smoking status: Former Smoker    Packs/day: 0.00    Last attempt to quit: 01/23/2018    Years since quitting: 0.5  . Smokeless tobacco: Never Used  . Tobacco comment: "few cigarettes per week"  Substance and Sexual Activity  . Alcohol use: Not Currently    Comment: 1 bottle of liqour per week   . Drug use: Not Currently    Types: Marijuana    Comment: August 2019  . Sexual activity: Yes    Birth control/protection: None  Lifestyle  . Physical activity:    Days per week: Not on file    Minutes per session: Not on file  . Stress: Not on file  Relationships  . Social connections:    Talks on phone: Not on file     Gets together: Not on file    Attends religious service: Not on file    Active member of club or organization: Not on file    Attends meetings of clubs or organizations: Not on file    Relationship status: Not on file  Other Topics Concern  . Not on file  Social History Narrative  . Not on file    No Known Allergies  No current facility-administered medications on file prior to encounter.    Current Outpatient Medications on File Prior to Encounter  Medication Sig Dispense Refill  . Misc. Devices (BREAST PUMP) MISC Electric double pump - Breast pump for client to use while breastfeeding. (Patient not taking: Reported on 08/08/2018) 1 each 0  . Prenatal Vit-Fe Fumarate-FA (PRENATAL COMPLETE) 14-0.4 MG TABS Take 1 tablet by mouth daily. 60 each 0  . triamcinolone cream (KENALOG) 0.1 % Apply 1 application topically 2 (two) times daily. 30 g 0     Review of Systems  Gastrointestinal: Negative for abdominal pain.  Genitourinary: Positive for vaginal bleeding and vaginal discharge.   Physical Exam   Vitals:   08/29/18 1257  BP: 115/66  Pulse: (!) 105  Resp: 18  Temp: 98.5 F (36.9 C)  TempSrc: Oral  SpO2: 98%   Physical Exam  Constitutional: She is oriented to person, place, and time. She appears well-developed and well-nourished. No distress.  HENT:  Head: Normocephalic and atraumatic.  Neck: Normal range of motion.  Respiratory: Effort normal. No respiratory distress.  Musculoskeletal: Normal range of motion.  Neurological: She is alert and oriented to person, place, and time.  Psychiatric: She has a normal mood and affect.  EFM: 135 bpm, mod variability, + accels, no decels Toco: rare  Results for orders placed or performed during the hospital encounter of 08/29/18 (from the past 24 hour(s))  Amnisure rupture of membrane (rom)not at [facility]     Status: None   Collection Time: 08/29/18  1:17 PM  Result Value Ref Range   Amnisure ROM NEGATIVE    MAU Course   Procedures  MDM Labs ordered and reviewed. SSE at office equivocal for fern. Amnisure negative. Stable for discharge home.   Assessment and Plan   1. [redacted] weeks gestation of pregnancy   2. NST (non-stress test) reactive   3. Vaginal discharge during pregnancy in third trimester    Discharge home Follow up for IOL as scheduled Labor precautions  Allergies as of 08/29/2018   No Known Allergies     Medication List    TAKE these medications   Breast Pump Misc Electric double pump - Breast pump for client to use while breastfeeding.   Prenatal Complete 14-0.4 MG Tabs Take 1 tablet by mouth daily.   triamcinolone cream 0.1 % Commonly known as:  KENALOG Apply 1 application topically 2 (two) times daily.       Donette LarryBhambri, Aren Cherne, CNM 08/29/2018 2:00 PM

## 2018-08-29 NOTE — Progress Notes (Signed)
   PRENATAL VISIT NOTE  Subjective:  Christy Hooper is a 18 y.o. G1P0 at [redacted]w[redacted]d being seen today for ongoing prenatal care.  She is currently monitored for the following issues for this low-risk pregnancy and has Supervision of other normal pregnancy, antepartum; Mild asthma; GBS bacteriuria; and Ovarian cyst affecting pregnancy, antepartum on their problem list.  Patient reports she is having pink fluid every time she wipes, having to use a pantyliner.  Contractions: Irregular. Vag. Bleeding: Small.  Movement: Present. Denies leaking of fluid.   The following portions of the patient's history were reviewed and updated as appropriate: allergies, current medications, past family history, past medical history, past social history, past surgical history and problem list.   Objective:   Vitals:   08/29/18 1127  BP: 110/66  Pulse: (!) 105  Temp: 98.6 F (37 C)  Weight: 167 lb 9.6 oz (76 kg)    Fetal Status: Fetal Heart Rate (bpm): 141   Movement: Present     General:  Alert, oriented and cooperative. Patient is in no acute distress.  Skin: Skin is warm and dry. No rash noted.   Cardiovascular: Normal heart rate noted  Respiratory: Normal respiratory effort, no problems with respiration noted  Abdomen: Soft, gravid, appropriate for gestational age.  Pain/Pressure: Present     Pelvic: Cervical exam performed      very trace pink bleeding from external cervix, scant discharge in vagina Fern: equivocal  Extremities: Normal range of motion.  Edema: Trace  Mental Status: Normal mood and affect. Normal behavior. Normal judgment and thought content.   Assessment and Plan:  Pregnancy: G1P0 at [redacted]w[redacted]d  1. Supervision of other normal pregnancy, antepartum IOL scheduled today Orders for admission placed  2. Ovarian cyst affecting pregnancy, antepartum  3. GBS bacteriuria ppx in labor  4. Vaginal discharge - equivocal fern - to MAU for rule out rupture  Term labor symptoms and general  obstetric precautions including but not limited to vaginal bleeding, contractions, leaking of fluid and fetal movement were reviewed in detail with the patient. Please refer to After Visit Summary for other counseling recommendations.   Return in about 1 week (around 09/05/2018) for BPP, NST.  Future Appointments  Date Time Provider Department Center  08/31/2018  7:30 AM [facility] SCHED ROOM [facility] None  09/04/2018  2:15 PM Marylene Land, CNM [facility]    Conan Bowens, MD

## 2018-08-29 NOTE — MAU Note (Signed)
Covid swab collected

## 2018-08-29 NOTE — Progress Notes (Signed)
Pt states 2 days ago she had light pink bleeding that started and was all over her panties and bed but is now only present when she wipes.  Pt states that after the bleeding started, she began to have contractions but they have since stopped.

## 2018-08-30 ENCOUNTER — Other Ambulatory Visit (HOSPITAL_COMMUNITY): Payer: Self-pay | Admitting: *Deleted

## 2018-08-30 ENCOUNTER — Other Ambulatory Visit (HOSPITAL_COMMUNITY): Payer: Self-pay | Admitting: Advanced Practice Midwife

## 2018-08-30 LAB — NOVEL CORONAVIRUS, NAA (HOSP ORDER, SEND-OUT TO REF LAB; TAT 18-24 HRS): SARS-CoV-2, NAA: NOT DETECTED

## 2018-08-31 ENCOUNTER — Encounter (HOSPITAL_COMMUNITY): Payer: Self-pay | Admitting: *Deleted

## 2018-08-31 ENCOUNTER — Inpatient Hospital Stay (HOSPITAL_COMMUNITY): Payer: Medicaid Other | Admitting: Anesthesiology

## 2018-08-31 ENCOUNTER — Inpatient Hospital Stay (HOSPITAL_COMMUNITY): Payer: Medicaid Other

## 2018-08-31 ENCOUNTER — Inpatient Hospital Stay (HOSPITAL_COMMUNITY)
Admission: AD | Admit: 2018-08-31 | Discharge: 2018-09-03 | DRG: 807 | Disposition: A | Discharge status: Home / Self Care | Payer: Medicaid Other | Attending: Obstetrics & Gynecology | Admitting: Obstetrics & Gynecology

## 2018-08-31 ENCOUNTER — Other Ambulatory Visit: Payer: Self-pay

## 2018-08-31 DIAGNOSIS — Z87891 Personal history of nicotine dependence: Secondary | ICD-10-CM | POA: Diagnosis not present

## 2018-08-31 DIAGNOSIS — O480 Post-term pregnancy: Principal | ICD-10-CM | POA: Diagnosis present

## 2018-08-31 DIAGNOSIS — O9952 Diseases of the respiratory system complicating childbirth: Secondary | ICD-10-CM | POA: Diagnosis present

## 2018-08-31 DIAGNOSIS — J45909 Unspecified asthma, uncomplicated: Secondary | ICD-10-CM | POA: Diagnosis present

## 2018-08-31 DIAGNOSIS — O3483 Maternal care for other abnormalities of pelvic organs, third trimester: Secondary | ICD-10-CM | POA: Diagnosis present

## 2018-08-31 DIAGNOSIS — R8271 Bacteriuria: Secondary | ICD-10-CM | POA: Diagnosis present

## 2018-08-31 DIAGNOSIS — N83209 Unspecified ovarian cyst, unspecified side: Secondary | ICD-10-CM | POA: Diagnosis present

## 2018-08-31 DIAGNOSIS — Z23 Encounter for immunization: Secondary | ICD-10-CM

## 2018-08-31 DIAGNOSIS — O99824 Streptococcus B carrier state complicating childbirth: Secondary | ICD-10-CM | POA: Diagnosis present

## 2018-08-31 DIAGNOSIS — Z3A40 40 weeks gestation of pregnancy: Secondary | ICD-10-CM | POA: Diagnosis not present

## 2018-08-31 LAB — CBC
HCT: 36.3 % (ref 36.0–46.0)
Hemoglobin: 12.2 g/dL (ref 12.0–15.0)
MCH: 29.4 pg (ref 26.0–34.0)
MCHC: 33.6 g/dL (ref 30.0–36.0)
MCV: 87.5 fL (ref 80.0–100.0)
Platelets: 151 10*3/uL (ref 150–400)
RBC: 4.15 MIL/uL (ref 3.87–5.11)
RDW: 13.4 % (ref 11.5–15.5)
WBC: 6.8 10*3/uL (ref 4.0–10.5)
nRBC: 0 % (ref 0.0–0.2)

## 2018-08-31 LAB — RPR: RPR Ser Ql: NONREACTIVE

## 2018-08-31 LAB — TYPE AND SCREEN
ABO/RH(D): A POS
Antibody Screen: NEGATIVE

## 2018-08-31 MED ORDER — ACETAMINOPHEN 325 MG PO TABS
650.0000 mg | ORAL_TABLET | ORAL | Status: DC | PRN
  Administered 2018-08-31: 650 mg via ORAL
  Filled 2018-08-31: qty 2

## 2018-08-31 MED ORDER — PENICILLIN G 3 MILLION UNITS IVPB - SIMPLE MED
3.0000 10*6.[IU] | INTRAVENOUS | Status: DC
  Administered 2018-08-31 – 2018-09-01 (×2): 3 10*6.[IU] via INTRAVENOUS
  Filled 2018-08-31 (×2): qty 100

## 2018-08-31 MED ORDER — LACTATED RINGERS IV SOLN
500.0000 mL | Freq: Once | INTRAVENOUS | Status: AC
  Administered 2018-08-31: 1000 mL via INTRAVENOUS

## 2018-08-31 MED ORDER — SODIUM CHLORIDE (PF) 0.9 % IJ SOLN
INTRAMUSCULAR | Status: DC | PRN
  Administered 2018-08-31: 12 mL/h via EPIDURAL

## 2018-08-31 MED ORDER — EPHEDRINE 5 MG/ML INJ: 10.0000 mg | INTRAVENOUS | Status: DC | PRN

## 2018-08-31 MED ORDER — LACTATED RINGERS IV SOLN: 500.0000 mL | INTRAVENOUS | Status: DC | PRN

## 2018-08-31 MED ORDER — FENTANYL-BUPIVACAINE-NACL 0.5-0.125-0.9 MG/250ML-% EP SOLN
12.0000 mL/h | EPIDURAL | Status: DC | PRN
  Filled 2018-08-31: qty 250

## 2018-08-31 MED ORDER — OXYTOCIN 40 UNITS IN NORMAL SALINE INFUSION - SIMPLE MED
2.5000 [IU]/h | INTRAVENOUS | Status: DC
  Administered 2018-09-01: 03:00:00 2.5 [IU]/h via INTRAVENOUS
  Filled 2018-08-31: qty 1000

## 2018-08-31 MED ORDER — PHENYLEPHRINE 40 MCG/ML (10ML) SYRINGE FOR IV PUSH (FOR BLOOD PRESSURE SUPPORT): 80.0000 ug | PREFILLED_SYRINGE | INTRAVENOUS | Status: DC | PRN

## 2018-08-31 MED ORDER — OXYTOCIN BOLUS FROM INFUSION
500.0000 mL | Freq: Once | INTRAVENOUS | Status: AC
  Administered 2018-09-01: 500 mL via INTRAVENOUS

## 2018-08-31 MED ORDER — SODIUM CHLORIDE 0.9 % IV SOLN
5.0000 10*6.[IU] | Freq: Once | INTRAVENOUS | Status: AC
  Administered 2018-08-31: 5 10*6.[IU] via INTRAVENOUS
  Filled 2018-08-31: qty 5

## 2018-08-31 MED ORDER — ONDANSETRON HCL 4 MG/2ML IJ SOLN
4.0000 mg | Freq: Four times a day (QID) | INTRAMUSCULAR | Status: DC | PRN
  Administered 2018-08-31: 4 mg via INTRAVENOUS
  Filled 2018-08-31: qty 2

## 2018-08-31 MED ORDER — MISOPROSTOL 25 MCG QUARTER TABLET
25.0000 ug | ORAL_TABLET | ORAL | Status: DC | PRN
  Administered 2018-08-31 (×2): 25 ug via VAGINAL
  Filled 2018-08-31 (×2): qty 1

## 2018-08-31 MED ORDER — DIPHENHYDRAMINE HCL 50 MG/ML IJ SOLN: 12.5000 mg | INTRAMUSCULAR | Status: DC | PRN

## 2018-08-31 MED ORDER — FENTANYL CITRATE (PF) 100 MCG/2ML IJ SOLN
100.0000 ug | INTRAMUSCULAR | Status: DC | PRN
  Administered 2018-08-31 (×2): 100 ug via INTRAVENOUS
  Filled 2018-08-31 (×2): qty 2

## 2018-08-31 MED ORDER — LIDOCAINE HCL (PF) 1 % IJ SOLN: 30.0000 mL | INTRAMUSCULAR | Status: DC | PRN

## 2018-08-31 MED ORDER — LIDOCAINE HCL (PF) 1 % IJ SOLN
INTRAMUSCULAR | Status: DC | PRN
  Administered 2018-08-31 (×2): 5 mL via EPIDURAL

## 2018-08-31 MED ORDER — OXYTOCIN 40 UNITS IN NORMAL SALINE INFUSION - SIMPLE MED
1.0000 m[IU]/min | INTRAVENOUS | Status: DC
  Administered 2018-08-31: 2 m[IU]/min via INTRAVENOUS
  Administered 2018-08-31: 4 m[IU]/min via INTRAVENOUS

## 2018-08-31 MED ORDER — LACTATED RINGERS IV SOLN
INTRAVENOUS | Status: DC
  Administered 2018-08-31 (×4): via INTRAVENOUS

## 2018-08-31 MED ORDER — SOD CITRATE-CITRIC ACID 500-334 MG/5ML PO SOLN: 30.0000 mL | ORAL | Status: DC | PRN

## 2018-08-31 MED ORDER — TERBUTALINE SULFATE 1 MG/ML IJ SOLN: 0.2500 mg | Freq: Once | INTRAMUSCULAR | Status: DC | PRN

## 2018-08-31 MED ORDER — PHENYLEPHRINE 40 MCG/ML (10ML) SYRINGE FOR IV PUSH (FOR BLOOD PRESSURE SUPPORT)
80.0000 ug | PREFILLED_SYRINGE | INTRAVENOUS | Status: DC | PRN
  Filled 2018-08-31: qty 10

## 2018-08-31 MED ORDER — FENTANYL-BUPIVACAINE-NACL 0.5-0.125-0.9 MG/250ML-% EP SOLN: 12.0000 mL/h | EPIDURAL | Status: DC | PRN

## 2018-08-31 NOTE — Anesthesia Preprocedure Evaluation (Signed)
Anesthesia Evaluation  Patient identified by MRN, date of birth, ID band Patient awake    Reviewed: Allergy & Precautions, H&P , NPO status , Patient's Chart, lab work & pertinent test results  History of Anesthesia Complications Negative for: history of anesthetic complications  Airway Mallampati: II  TM Distance: >3 FB Neck ROM: full    Dental no notable dental hx. (+) Teeth Intact   Pulmonary asthma , former smoker,    Pulmonary exam normal breath sounds clear to auscultation       Cardiovascular negative cardio ROS Normal cardiovascular exam Rhythm:regular Rate:Normal     Neuro/Psych negative neurological ROS  negative psych ROS   GI/Hepatic negative GI ROS, Neg liver ROS,   Endo/Other  negative endocrine ROS  Renal/GU negative Renal ROS  negative genitourinary   Musculoskeletal   Abdominal   Peds  Hematology negative hematology ROS (+)   Anesthesia Other Findings   Reproductive/Obstetrics (+) Pregnancy                             Anesthesia Physical Anesthesia Plan  ASA: II  Anesthesia Plan: Epidural   Post-op Pain Management:    Induction:   PONV Risk Score and Plan:   Airway Management Planned:   Additional Equipment:   Intra-op Plan:   Post-operative Plan:   Informed Consent: I have reviewed the patients History and Physical, chart, labs and discussed the procedure including the risks, benefits and alternatives for the proposed anesthesia with the patient or authorized representative who has indicated his/her understanding and acceptance.     Plan Discussed with:   Anesthesia Plan Comments:         Anesthesia Quick Evaluation  

## 2018-08-31 NOTE — Progress Notes (Signed)
Patient ID: Christy Hooper, female   DOB: 2000/07/28, 18 y.o.   MRN: [id number]  Doing well; sleepy; s/p cyto x 1  BP 100/52, P 82 FHR 120s, +accels, no decels, Cat 1 Ctx irreg 1-5 mins Cx ant/1/thick/vtx -2  IUP@term  Cx unfavorable GBS pos  Cervical foley inserted easily; second dose cyto given; plan for PCN with ROM, Pit or active labor  [name] CNM 08/31/2018 1:18 PM

## 2018-08-31 NOTE — Progress Notes (Signed)
Patient ID: Christy Hooper, female   DOB: [birth year], 18 y.o.   MRN: [id number]  Cervical foley came out when pt was up in the bathroom just before 1600  BP 125/73, P 78 FHR 120s, +accels, no decels, Cat 1 Ctx q 1-3 mins Cx 6/60/vtx -2 per RN exam  IUP@term  Early active labor GBS pos  PCN started; Pitocin started; pt planning epidural soon; anticipate SVD  [name] [facility] 08/31/2018 5:22 PM

## 2018-08-31 NOTE — H&P (Signed)
Christy Hooper is a 18 y.o. female G1 @ 40.0wks by LMP and confirmed by 7wk scan presenting for IOL due to elective at term. Denies leaking or bldg; no H/A, N/V or visual disturbances. Her preg has been followed by the [facility] office and has been remarkable for:  # ovarian cyst (9x6x8cm @ 25wk scan- most recent) # mild asthma (no inhaler) # GBS bacteruria  OB History    Gravida  1   Para  0   Term  0   Preterm  0   AB  0   Living  0     SAB  0   TAB  0   Ectopic  0   Multiple  0   Live Births  0          Past Medical History:  Diagnosis Date  . Asthma    has not had an asthma attack or used inhaler since middle school   Past Surgical History:  Procedure Laterality Date  . TONSILLECTOMY    . WISDOM TOOTH EXTRACTION     Family History: family history includes Cancer in her paternal aunt and another family member; Depression in her maternal grandfather and maternal grandmother. Social History:  reports that she quit smoking about 7 months ago. She smoked 0.00 packs per day. She has never used smokeless tobacco. She reports previous alcohol use. She reports previous drug use. Drug: Marijuana.     Maternal Diabetes: No Genetic Screening: Normal Maternal Ultrasounds/Referrals: Normal Fetal Ultrasounds or other Referrals:  None Maternal Substance Abuse:  No Significant Maternal Medications:  None Significant Maternal Lab Results:  Lab values include: Group B Strep positive Other Comments:  None  ROS History Dilation: 1 Effacement (%): 60 Station: -3 Exam by:: Enis Slipper, RN Blood pressure 116/67, pulse 86, temperature 98 F (36.7 C), temperature source Oral, resp. rate 18, height 5\' 3"  (1.6 m), weight 76.3 kg, last menstrual period 11/24/2017. Exam Physical Exam  Constitutional: She is oriented to person, place, and time. She appears well-developed.  HENT:  Head: Normocephalic.  Neck: Normal range of motion.  Cardiovascular: Normal rate.  Respiratory:  Effort normal.  GI:  EFM 120-130, +accels, no decels, Cat 1 Irreg mild ctx  Musculoskeletal: Normal range of motion.  Neurological: She is alert and oriented to person, place, and time.  Skin: Skin is warm and dry.  Psychiatric: She has a normal mood and affect. Her behavior is normal. Thought content normal.    Prenatal labs: ABO, Rh: A/Positive/-- (10/29 1501) Antibody: Negative (10/29 1501) Rubella: 6.60 (10/29 1501) RPR: Non Reactive (02/18 0820)  HBsAg: Negative (10/29 1501)  HIV: Non Reactive (02/18 0820)  GBS:   positive (urine culture 04/21/18)  Assessment/Plan: IUP@40 .0wks Cx unfavorable GBS pos  -Admit to Labor & Delivery -Plan cx ripening with cytotec to start, and possibly cervical foley/Pit/AROM -PCN for GBS ppx -Anticipate SVD   [name] CNM 08/31/2018, 9:09 AM

## 2018-08-31 NOTE — Anesthesia Procedure Notes (Signed)
Epidural Patient location during procedure: OB  Staffing Anesthesiologist: Phillips Grout, MD Performed: anesthesiologist   Preanesthetic Checklist Completed: patient identified, site marked, surgical consent, pre-op evaluation, timeout performed, IV checked, risks and benefits discussed and monitors and equipment checked  Epidural Patient position: sitting Prep: DuraPrep Patient monitoring: heart rate, continuous pulse ox and blood pressure Approach: right paramedian Location: L4-L5 Injection technique: LOR saline  Needle:  Needle type: Tuohy  Needle gauge: 17 G Needle length: 9 cm and 9 Needle insertion depth: 4 cm Catheter type: closed end flexible Catheter size: 20 Guage Catheter at skin depth: 9 cm Test dose: negative  Assessment Events: blood not aspirated, injection not painful, no injection resistance, negative IV test and no paresthesia  Additional Notes Patient identified. Risks/Benefits/Options discussed with patient including but not limited to bleeding, infection, nerve damage, paralysis, failed block, incomplete pain control, headache, blood pressure changes, nausea, vomiting, reactions to medication both or allergic, itching and postpartum back pain. Confirmed with bedside nurse the patient's most recent platelet count. Confirmed with patient that they are not currently taking any anticoagulation, have any bleeding history or any family history of bleeding disorders. Patient expressed understanding and wished to proceed. All questions were answered. Sterile technique was used throughout the entire procedure. Please see nursing notes for vital signs. Test dose was given through epidural needle and negative prior to continuing to dose epidural or start infusion. Warning signs of high block given to the patient including shortness of breath, tingling/numbness in hands, complete motor block, or any concerning symptoms with instructions to call for help. Patient was given  instructions on fall risk and not to get out of bed. All questions and concerns addressed with instructions to call with any issues.

## 2018-08-31 NOTE — Progress Notes (Signed)
Patient ID: Christy Hooper, female   DOB: 2000-07-11, 18 y.o.   MRN: [id number]  Pt comfortable w/ epidural for the most part  BP 119/72, P 91 FHR 130s, +accels, occ mi variables Ctx q 2 mins with Pit @ 78mu/min Cx 4/60/vtx -2; SROM with exam for sm blood-tinged fluid  IUP@term  IOL process GBS pos  Continue titrating Pitocin to achieve active labor  [name] CNM 08/31/2018 11:02 PM

## 2018-09-01 DIAGNOSIS — O99824 Streptococcus B carrier state complicating childbirth: Secondary | ICD-10-CM

## 2018-09-01 DIAGNOSIS — Z3A40 40 weeks gestation of pregnancy: Secondary | ICD-10-CM

## 2018-09-01 MED ORDER — MISOPROSTOL 200 MCG PO TABS: 800.0000 ug | ORAL_TABLET | Freq: Once | ORAL | Status: DC

## 2018-09-01 MED ORDER — SENNOSIDES-DOCUSATE SODIUM 8.6-50 MG PO TABS
2.0000 | ORAL_TABLET | ORAL | Status: DC
  Administered 2018-09-01 – 2018-09-03 (×2): 2 via ORAL
  Filled 2018-09-01 (×2): qty 2

## 2018-09-01 MED ORDER — DIPHENHYDRAMINE HCL 25 MG PO CAPS: 25.0000 mg | ORAL_CAPSULE | Freq: Four times a day (QID) | ORAL | Status: DC | PRN

## 2018-09-01 MED ORDER — IBUPROFEN 600 MG PO TABS
600.0000 mg | ORAL_TABLET | Freq: Four times a day (QID) | ORAL | Status: DC
  Administered 2018-09-01 – 2018-09-03 (×10): 600 mg via ORAL
  Filled 2018-09-01 (×10): qty 1

## 2018-09-01 MED ORDER — MISOPROSTOL 200 MCG PO TABS
ORAL_TABLET | ORAL | Status: AC
  Administered 2018-09-01: 800 ug
  Filled 2018-09-01: qty 4

## 2018-09-01 MED ORDER — OXYCODONE HCL 5 MG PO TABS
5.0000 mg | ORAL_TABLET | ORAL | Status: DC | PRN
  Administered 2018-09-01 (×2): 5 mg via ORAL
  Filled 2018-09-01 (×2): qty 1

## 2018-09-01 MED ORDER — ACETAMINOPHEN 325 MG PO TABS
650.0000 mg | ORAL_TABLET | ORAL | Status: DC | PRN
  Administered 2018-09-01 (×2): 650 mg via ORAL
  Filled 2018-09-01 (×2): qty 2

## 2018-09-01 MED ORDER — MISOPROSTOL 200 MCG PO TABS
ORAL_TABLET | ORAL | Status: AC
  Filled 2018-09-01: qty 1

## 2018-09-01 MED ORDER — ZOLPIDEM TARTRATE 5 MG PO TABS: 5.0000 mg | ORAL_TABLET | Freq: Every evening | ORAL | Status: DC | PRN

## 2018-09-01 MED ORDER — SIMETHICONE 80 MG PO CHEW: 80.0000 mg | CHEWABLE_TABLET | ORAL | Status: DC | PRN

## 2018-09-01 MED ORDER — COCONUT OIL OIL
1.0000 "application " | TOPICAL_OIL | Status: DC | PRN
  Administered 2018-09-02: 1 via TOPICAL

## 2018-09-01 MED ORDER — BENZOCAINE-MENTHOL 20-0.5 % EX AERO
1.0000 "application " | INHALATION_SPRAY | CUTANEOUS | Status: DC | PRN
  Administered 2018-09-02: 1 via TOPICAL
  Filled 2018-09-01: qty 56

## 2018-09-01 MED ORDER — WITCH HAZEL-GLYCERIN EX PADS: 1.0000 "application " | MEDICATED_PAD | CUTANEOUS | Status: DC | PRN

## 2018-09-01 MED ORDER — PRENATAL MULTIVITAMIN CH
1.0000 | ORAL_TABLET | Freq: Every day | ORAL | Status: DC
  Administered 2018-09-01 – 2018-09-03 (×3): 1 via ORAL
  Filled 2018-09-01 (×3): qty 1

## 2018-09-01 MED ORDER — ONDANSETRON HCL 4 MG/2ML IJ SOLN: 4.0000 mg | INTRAMUSCULAR | Status: DC | PRN

## 2018-09-01 MED ORDER — TETANUS-DIPHTH-ACELL PERTUSSIS 5-2.5-18.5 LF-MCG/0.5 IM SUSP: 0.5000 mL | Freq: Once | INTRAMUSCULAR | Status: DC

## 2018-09-01 MED ORDER — ONDANSETRON HCL 4 MG PO TABS: 4.0000 mg | ORAL_TABLET | ORAL | Status: DC | PRN

## 2018-09-01 MED ORDER — DIBUCAINE (PERIANAL) 1 % EX OINT: 1.0000 "application " | TOPICAL_OINTMENT | CUTANEOUS | Status: DC | PRN

## 2018-09-01 NOTE — Discharge Summary (Signed)
Postpartum Discharge Summary     Patient Name: Christy Hooper DOB: 09-29-2000 MRN: [id number]  Date of admission: 08/31/2018 Delivering Provider: Cam Hai D   Date of discharge: 09/03/2018  Admitting diagnosis: INDUCTION Intrauterine pregnancy: [redacted]w[redacted]d     Secondary diagnosis:  Active Problems:   Mild asthma   GBS bacteriuria   Ovarian cyst affecting pregnancy, antepartum   Post-dates pregnancy  Additional problems: none     Discharge diagnosis: Term Pregnancy Delivered                                                                                                Post partum procedures:none  Augmentation: Pitocin, Cytotec and Foley Balloon  Complications: None  Hospital course:  Induction of Labor With Vaginal Delivery   18 y.o. yo G1P0000 at [redacted]w[redacted]d was admitted to the hospital 08/31/2018 for induction of labor.  Indication for induction: elective at 40.0wks.  Patient had an uncomplicated labor course as follows, delivering approximately 18 hrs after IOL began. She rec'd PCN for GBS ppx. Membrane Rupture Time/Date: 10:32 PM ,08/31/2018   Intrapartum Procedures: Episiotomy: None [1]                                         Lacerations:  None [1]  Patient had delivery of a Viable infant.  Information for the patient's newborn:  Lilianne, Dronen [[id number]]  Delivery Method: Vaginal, Spontaneous(Filed from Delivery Summary)   09/01/2018  Details of delivery can be found in separate delivery note.  Patient had a routine postpartum course. Patient is discharged home 09/03/18.  Magnesium Sulfate recieved: No BMZ received: No  Physical exam  Vitals:   09/02/18 0502 09/02/18 1539 09/02/18 2115 09/03/18 0524  BP: 127/78 117/83 110/74 106/73  Pulse: 63 (!) 56 83 88  Resp: 16 16 18 16   Temp: 97.8 F (36.6 C) 98 F (36.7 C) 98.2 F (36.8 C) 98.4 F (36.9 C)  TempSrc: Oral Oral Oral Oral  SpO2:  99%    Weight:      Height:       General: alert, cooperative and no  distress Lochia: appropriate Uterine Fundus: firm Incision: N/A DVT Evaluation: No evidence of DVT seen on physical exam. Labs: Lab Results  Component Value Date   WBC 6.8 08/31/2018   HGB 12.2 08/31/2018   HCT 36.3 08/31/2018   MCV 87.5 08/31/2018   PLT 151 08/31/2018   No flowsheet data found.  Discharge instruction: per After Visit Summary and "Baby and Me Booklet".  After visit meds:  Allergies as of 09/03/2018   No Known Allergies     Medication List    STOP taking these medications   triamcinolone cream 0.1 % Commonly known as:  KENALOG     TAKE these medications   acetaminophen 325 MG tablet Commonly known as:  Tylenol Take 2 tablets (650 mg total) by mouth every 4 (four) hours as needed (for pain scale < 4).   Breast Pump Misc Electric double pump -  Breast pump for client to use while breastfeeding.   ibuprofen 600 MG tablet Commonly known as:  ADVIL Take 1 tablet (600 mg total) by mouth every 6 (six) hours.   Prenatal Complete 14-0.4 MG Tabs Take 1 tablet by mouth daily.       Diet: routine diet  Activity: Advance as tolerated. Pelvic rest for 6 weeks.   Outpatient follow up:4 weeks Follow up Appt: Future Appointments  Date Time Provider Department Center  09/04/2018  2:15 PM Marylene LandKooistra, Ilya Neely Lorraine, CNM [facility]   Follow up Visit:  Please schedule this patient for Postpartum visit in: 4 weeks with the following provider: Any provider For C/S patients schedule nurse incision check in weeks 2 weeks: no Low risk pregnancy complicated by: none Delivery mode:  SVD Anticipated Birth Control:  IUD PP Procedures needed: none  Schedule Integrated BH visit: no    Newborn Data: Live born female  Birth Weight: 3410gm (7lb 8.3oz)  APGAR: 8, 9  Newborn Delivery   Birth date/time:  09/01/2018 02:43:00 Delivery type:  Vaginal, Spontaneous     Baby Feeding: Breast Disposition:home with mother   09/03/2018 Marylene LandKathryn Lorraine Cecylia Brazill,  CNM

## 2018-09-01 NOTE — Lactation Note (Signed)
This note was copied from a baby's chart. Lactation Consultation Note  Patient Name: Christy Hooper JOITG'P Date: 09/01/2018 Reason for consult: Initial assessment;Primapara;1st time breastfeeding;Term  10 hours old FT female who is being exclusively BF by his mother, she's a P1. Mom didn't have a change to take BF classes during the pregnancy due to the virus. She participated in the [facility] program at the [facility] and she's already familiar with hand expression. When LC revised hand expression with mom she was able to get a small drop of colostrum out of her left breast, but not the right one. Noticed that mom's tissue is not very compressible and her nipples are short-shafted. LC set her up with a hand pump (she doesn't have one at home either) and breast shells. Instructions, cleaning and storage were reviewed, as well as milk storage guidelines.  Offered assistance with latch and mom cheerfully agreed, baby has not fed since 6 am, parents had already tried to feed him about an hour ago. LC had to change baby's diaper and used bulb syringe because baby was very gaggy and spitty. He had a stool and a large emesis, but he still wouldn't latch at the breast, tried the crossed cradle hold in the right breast and the football on the left, but baby would only do a few sucks and the stop (or burp). An attempt was documented in Flowsheets. Reviewed normal newborn behavior, cluster feeding and feeding cues.  Feeding plan:  1. Encouraged mom to feed baby STS 8-12 times/24 hours or sooner if feeding cues are present 2. Hand expression and spoon feeding were also encouraged 3. Mom will pre-pump for 5 minutes prior feedings 4. She'll start wearing her breast shells today, daytime only  BF brochure, BF resources and feeding diary were reviewed. Parents reported all questions and concerns were answered, they're both aware of LC services and will call PRN.  Maternal Data Formula Feeding for Exclusion: Yes Reason  for exclusion: Mother's choice to formula and breast feed on admission Has patient been taught Hand Expression?: Yes Does the patient have breastfeeding experience prior to this delivery?: No  Feeding Feeding Type: Breast Fed   Interventions Interventions: Breast feeding basics reviewed;Assisted with latch;Skin to skin;Breast massage;Hand express;Breast compression;Shells;Position options;Support pillows;Adjust position  Lactation Tools Discussed/Used Tools: Pump;Shells Shell Type: Inverted Breast pump type: Manual WIC Program: Yes Pump Review: Setup, frequency, and cleaning;Milk Storage Initiated by:: MPeck Date initiated:: 09/01/18   Consult Status Consult Status: Follow-up Date: 09/02/18 Follow-up type: In-patient    Christy Hooper 09/01/2018, 12:45 PM

## 2018-09-01 NOTE — Anesthesia Postprocedure Evaluation (Signed)
Anesthesia Post Note  Patient: Christy Hooper  Procedure(s) Performed: AN AD HOC LABOR EPIDURAL     Patient location during evaluation: Mother Baby Anesthesia Type: Epidural Level of consciousness: awake and alert Pain management: pain level controlled Vital Signs Assessment: post-procedure vital signs reviewed and stable Respiratory status: spontaneous breathing Cardiovascular status: blood pressure returned to baseline and stable Postop Assessment: no headache and able to ambulate Anesthetic complications: no    Last Vitals:  Vitals:   09/01/18 1052 09/01/18 1440  BP: 103/72 112/68  Pulse: 94 90  Resp: 18 18  Temp: 36.8 C 37.1 C  SpO2:      Last Pain:  Vitals:   09/01/18 1440  TempSrc: Oral  PainSc: 0-No pain   Pain Goal: Patients Stated Pain Goal: 6 (08/31/18 0757)                 Marny Lowenstein

## 2018-09-02 NOTE — Progress Notes (Signed)
Post Partum Day 1 Subjective: no complaints, up ad lib, voiding and tolerating PO, small lochia, plans to breastfeed, IUD  Objective: Blood pressure 127/78, pulse 63, temperature 97.8 F (36.6 C), temperature source Oral, resp. rate 16, height 5\' 3"  (1.6 m), weight 76.3 kg, last menstrual period 11/24/2017, SpO2 99 %.  Physical Exam:  General: alert, cooperative and no distress Lochia:normal flow Chest: CTAB Heart: RRR no m/r/g Abdomen: +BS, soft, nontender,  Uterine Fundus: firm DVT Evaluation: No evidence of DVT seen on physical exam. Extremities: trace edema  Recent Labs    08/31/18 0808  HGB 12.2  HCT 36.3    Assessment/Plan: Plan for discharge tomorrow   LOS: 2 days   Christy Hooper 09/02/2018, 7:52 AM

## 2018-09-03 MED ORDER — PNEUMOCOCCAL VAC POLYVALENT 25 MCG/0.5ML IJ INJ
0.5000 mL | INJECTION | INTRAMUSCULAR | Status: AC
  Administered 2018-09-03: 0.5 mL via INTRAMUSCULAR
  Filled 2018-09-03: qty 0.5

## 2018-09-03 MED ORDER — IBUPROFEN 600 MG PO TABS: 600.0000 mg | ORAL_TABLET | Freq: Four times a day (QID) | ORAL | 0 refills | Status: DC

## 2018-09-03 MED ORDER — ACETAMINOPHEN 325 MG PO TABS: 650.0000 mg | ORAL_TABLET | ORAL | 1 refills | Status: DC | PRN

## 2018-09-03 NOTE — Progress Notes (Signed)
Pt expressed concern to noc shift RN and this RN re: burning with urination.  Per delivery record did not have episiotomy or laceration.  Pt does not have any other symptoms and does not have a history of UTIs.  Pt was given dermoplast by noc shift RN and states it is helpful.  Day shift RN gave her a sitz bath.  CNM was called and ordered a urine culture, without a U/A, from a clean catch specimen.  Pt instructed on how to collect specimen.  Delice Bison [place] [employer]ity fundraiser

## 2018-09-03 NOTE — Lactation Note (Signed)
This note was copied from a baby's chart. Lactation Consultation Note  Patient Name: Christy Hooper Evalena Clute FSFSE'L Date: 09/03/2018 Reason for consult: Follow-up assessment;Primapara;1st time breastfeeding;Term  0829 - 0849 - Ms. Nearing was breast feeding upon entry. She states that her nipples are slightly tender from frequent feedings, and she has been treating with coconut oil.  I helped mom adjust positron into a cross cradle hold. I showed mom how to hold breast in a "U" hold to gain a deeper latch. I also showed mom and dad how to tug baby's chin for better flanging and how to gently "pester" baby to keep him active at the breast. Baby latches eagerly with little intervention. I encouraged mom to compress her breast to help him achieve a deeper latch.  Mom's plan is to breast feed and to offer formula. I encouraged mom to breast feed first and to offer formula as needed. She does not have anyone in her immediate support network who breast fed. Her significant other is quite supportive. I encouraged her to take advantage of the support groups.   We reviewed discharge education including feeding frequency, when to expect mature milk to transition, how to manage engorgement and to treat clogged milk ducts, pumping and milk storage guidelines,community breast feeding resources, etc.  I discussed diet and breast feeding and benefits of breast milk. Mom has a hand pump with her but no DEBP. I offered to send a referral to [facility] for follow up, and she agreed.  Maternal Data Formula Feeding for Exclusion: No Has patient been taught Hand Expression?: Yes Does the patient have breastfeeding experience prior to this delivery?: No  Feeding Feeding Type: Breast Fed  LATCH Score Latch: Grasps breast easily, tongue down, lips flanged, rhythmical sucking.  Audible Swallowing: A few with stimulation  Type of Nipple: Everted at rest and after stimulation  Comfort (Breast/Nipple): Soft /  non-tender  Hold (Positioning): Assistance needed to correctly position infant at breast and maintain latch.  LATCH Score: 8  Interventions Interventions: Breast feeding basics reviewed;Assisted with latch;Skin to skin;Breast compression;Adjust position;Support pillows;Shells;Coconut oil  Lactation Tools Discussed/Used WIC Program: Yes Pump Review: Milk Storage   Consult Status Consult Status: Complete Date: 09/03/18 Follow-up type: Other (comment)(WIC referral; call as needed)    Walker Shadow 09/03/2018, 9:38 AM

## 2018-09-04 ENCOUNTER — Telehealth (HOSPITAL_COMMUNITY): Payer: Self-pay | Admitting: Lactation Services

## 2018-09-04 ENCOUNTER — Encounter: Payer: Medicaid Other | Admitting: Student

## 2018-09-04 LAB — CULTURE, OB URINE
Culture: NO GROWTH
Special Requests: NORMAL

## 2018-09-04 NOTE — Telephone Encounter (Signed)
Called mom back as she has questions about BF.   Mom reports infant is not latching as well. She has pumped and bottle fed him some.   Her milk is in. She is leaking and is concerned. She has pumped today and got 2-4 ounce bottles of milk earlier. She did give him a bottle of breast milk today and he took at least 2 ounces. Pt has not been giving formula as she reports infant does not like it. Enc mom to use her milk for infant. Discussed leaking is normal and to wear breast pads as needed. Discussed leaking should improve once milk supply gets regulated to infant needs.   Pt is wearing shells all the time. Enc her to only wear during the day to prevent plugged ducts. Discussed prepumping to soften areola as needed and to help evert nipple as needed. Enc pt to pump and bottle feed infant if he will not latch as she has been.   Mom reports infant is sleepy and not eating at the breast for as long. Mom reports some pain with feeding that she relatches as needed. She has had blisters that are healing. She is using Coconut oil to her nipples.   Pt to call back as needed and to let us know if she would like a follow up Lactation visit. Pt voiced understanding.

## 2018-10-03 ENCOUNTER — Telehealth (INDEPENDENT_AMBULATORY_CARE_PROVIDER_SITE_OTHER): Payer: Medicaid Other | Admitting: *Deleted

## 2018-10-03 DIAGNOSIS — N898 Other specified noninflammatory disorders of vagina: Secondary | ICD-10-CM

## 2018-10-03 NOTE — Telephone Encounter (Signed)
-----   Message from Valene Bors, [place] sent at 09/26/2018  3:50 PM EDT ----- Regarding: recent PP home visit [place] with [place] had virtual visit with pt today. Pt is PP, delivered on 5/23- states she is now having yellow d/c with some odor.  Pt does have an appt next week with provider in clinic office.  Please call pt and discuss.   Thanks

## 2018-10-03 NOTE — Telephone Encounter (Signed)
Called pt regarding her discharge.  Per review, pt has an appointment on 6/26 with a provider in the office.  Pt states she not longer is experiencing the vaginal discharge.  Pt denies any other problems.  Pt will follow up at the clinic on Friday for her postpartum appointment.

## 2018-10-05 ENCOUNTER — Other Ambulatory Visit: Payer: Self-pay

## 2018-10-05 ENCOUNTER — Encounter: Payer: Self-pay | Admitting: Obstetrics & Gynecology

## 2018-10-05 ENCOUNTER — Ambulatory Visit (INDEPENDENT_AMBULATORY_CARE_PROVIDER_SITE_OTHER): Payer: Medicaid Other | Admitting: Obstetrics & Gynecology

## 2018-10-05 LAB — POCT PREGNANCY, URINE: Preg Test, Ur: NEGATIVE

## 2018-10-05 NOTE — Patient Instructions (Signed)
Return to clinic for any scheduled appointments or for any gynecologic concerns as needed.     Intrauterine Device Insertion An intrauterine device (IUD) is a medical device that gets inserted into the uterus to prevent pregnancy. It is a small, T-shaped device that has one or two nylon strings hanging down from it. The strings hang out of the lower part of the uterus (cervix) to allow for future IUD removal. There are two types of IUDs available:  Copper IUD. This type of IUD has copper wire wrapped around it. Copper makes the uterus and fallopian tubes produce a fluid that kills sperm. A copper IUD may last up to 10 years.  Hormone IUD. This type of IUD is made of plastic and contains the hormone progestin (synthetic progesterone). The hormone thickens mucus in the cervix and prevents sperm from entering the uterus. It also thins the uterine lining to prevent implantation of a fertilized egg. The hormone can weaken or kill the sperm that get into the uterus. A hormone IUD may last 3-5 years. Tell a health care provider about:  Any allergies you have.  All medicines you are taking, including vitamins, herbs, eye drops, creams, and over-the-counter medicines.  Any problems you or family members have had with anesthetic medicines.  Any blood disorders you have.  Any surgeries you have had.  Any medical conditions you have, including any STIs (sexually transmitted infections) you may have.  Whether you are pregnant or may be pregnant. What are the risks? Generally, this is a safe procedure. However, problems may occur, including:  Infection.  Bleeding.  Allergic reactions to medicines.  Accidental puncture (perforation) of the uterus, or damage to other structures or organs.  Accidental placement of the IUD either in the muscle layer of the uterus (myometrium) or outside the uterus.  The IUD falling out of the uterus (expulsion). This is more common among women who have recently  had a child.  Pregnancy that happens in the fallopian tube (ectopic pregnancy).  Infection of the uterus and fallopian tubes (pelvic inflammatory disease). What happens before the procedure?  Schedule the IUD insertion for when you will have your menstrual period or right after, to make sure you are not pregnant. Placement of the IUD is better tolerated shortly after a menstrual cycle.  Follow instructions from your health care provider about eating or drinking restrictions.  Ask your health care provider about changing or stopping your regular medicines. This is especially important if you are taking diabetes medicines or blood thinners.  You may get a pain reliever to take before the procedure.  You may have tests for: ? Pregnancy. A pregnancy test involves having a urine sample taken. ? STIs. Placing an IUD in someone who has an STI can make the infection worse. ? Cervical cancer. You may have a Pap test to check for this type of cancer. This means collecting cells from your cervix to be examined under a microscope.  You may have a physical exam to determine the size and position of your uterus. The procedure may vary among health care providers and hospitals. What happens during the procedure?  A tool (speculum) will be placed in your vagina and widened so that your health care provider can see your cervix.  Medicine may be applied to your cervix to help lower your risk of infection (antiseptic medicine).  You may be given an anesthetic medicine to numb each side of your cervix (intracervical block or paracervical block). This medicine is  usually given by an injection into the cervix.  A tool (uterine sound) will be inserted into your uterus to determine the length of your uterus and the direction that your uterus may be tilted.  A slim instrument or tube (IUD inserter) that holds the IUD will be inserted into your vagina, through your cervical canal, and into your uterus.  The  IUD will be placed in the uterus, and the IUD inserter will be removed.  The strings that are attached to the IUD will be trimmed so that they lie just below the cervix. The procedure may vary among health care providers and hospitals. What happens after the procedure?  You may have bleeding after the procedure. This is normal. It varies from light bleeding (spotting) for a few days to menstrual-like bleeding.  You may have cramping and pain.  You may feel dizzy or light-headed.  You may have lower back pain. Summary  An intrauterine device (IUD) is a small, T-shaped device that has one or two nylon strings hanging down from it.  Two types of IUDs are available. You may have a copper IUD or a hormone IUD.  Schedule the IUD insertion for when you will have your menstrual period or right after, to make sure you are not pregnant. Placement of the IUD is better tolerated shortly after a menstrual cycle.  You may have bleeding after the procedure. This is normal. It varies from light spotting for a few days to menstrual-like bleeding. This information is not intended to replace advice given to you by your health care provider. Make sure you discuss any questions you have with your health care provider. Document Released: 11/24/2010 Document Revised: 02/17/2016 Document Reviewed: 02/17/2016 Elsevier Interactive Patient Education  2019 [employer]

## 2018-10-05 NOTE — Progress Notes (Signed)
Subjective:     Christy Hooper is a 18 y.o. G1P1 female who presents for a postpartum visit. She is 5 weeks postpartum following a spontaneous vaginal delivery. I have fully reviewed the prenatal and intrapartum course. The delivery was at 40 gestational weeks. Outcome: spontaneous vaginal delivery. Anesthesia: epidural. Postpartum course has been uncomplicated.  Baby's course has been uncomplicated. Baby is feeding by breast. Bleeding staining only. Bowel function is normal. Bladder function is normal. Patient is sexually active, did not use any contraception.  Desired contraception method is IUD. Postpartum depression screening: negative.  The following portions of the patient's history were reviewed and updated as appropriate: allergies, current medications, past family history, past medical history, past social history, past surgical history and problem list.  Review of Systems Pertinent items noted in HPI and remainder of comprehensive ROS otherwise negative.   Objective:    BP 113/73   Pulse 80   Temp 97.9 F (36.6 C)   Wt 150 lb 11.2 oz (68.4 kg)   LMP 11/24/2017 (Approximate) Comment: pt shielded  Breastfeeding Unknown   BMI 26.70 kg/m   General:  alert and no distress   Breasts:  inspection negative, no nipple discharge or bleeding, no masses or nodularity palpable  Lungs: clear to auscultation bilaterally  Heart:  regular rate and rhythm  Abdomen: soft, non-tender; bowel sounds normal; no masses,  no organomegaly  Pelvic:  not evaluated        Assessment:    Normal postpartum exam. Desires IUD, but unable to place today due to recent unprotected IC.   Plan:    Contraception: condoms/abstinence recommended for next two weeks for IUD insertion. Counseled about IUD insertion.  Return in about 2 weeks (around 10/19/2018) for IUD Insertion.

## 2018-10-22 ENCOUNTER — Encounter: Payer: Self-pay | Admitting: *Deleted

## 2018-10-23 DIAGNOSIS — Z029 Encounter for administrative examinations, unspecified: Secondary | ICD-10-CM

## 2018-10-26 ENCOUNTER — Ambulatory Visit (INDEPENDENT_AMBULATORY_CARE_PROVIDER_SITE_OTHER): Payer: Medicaid Other | Admitting: Obstetrics & Gynecology

## 2018-10-26 ENCOUNTER — Telehealth: Payer: Self-pay | Admitting: Obstetrics & Gynecology

## 2018-10-26 ENCOUNTER — Other Ambulatory Visit: Payer: Self-pay

## 2018-10-26 ENCOUNTER — Encounter: Payer: Self-pay | Admitting: Obstetrics & Gynecology

## 2018-10-26 VITALS — BP 102/65 | HR 62 | Temp 97.8°F | Ht 63.0 in | Wt 149.0 lb

## 2018-10-26 DIAGNOSIS — Z30430 Encounter for insertion of intrauterine contraceptive device: Secondary | ICD-10-CM | POA: Diagnosis present

## 2018-10-26 DIAGNOSIS — Z3202 Encounter for pregnancy test, result negative: Secondary | ICD-10-CM

## 2018-10-26 LAB — POCT PREGNANCY, URINE: Preg Test, Ur: NEGATIVE

## 2018-10-26 MED ORDER — IBUPROFEN 200 MG PO TABS
800.0000 mg | ORAL_TABLET | Freq: Once | ORAL | Status: AC
  Administered 2018-10-26: 800 mg via ORAL

## 2018-10-26 NOTE — Progress Notes (Signed)
    GYNECOLOGY OFFICE PROCEDURE NOTE  Christy Hooper is a 18 y.o. G1P0000 here for [place] IUD insertion. No GYN concerns  IUD Insertion Procedure Note Patient identified, informed consent performed, consent signed.   Discussed risks of irregular bleeding, cramping, infection, malpositioning or misplacement of the IUD outside the uterus which may require further procedure such as laparoscopy. Time out was performed.  Urine pregnancy test negative.  Speculum placed in the vagina.  Cervix visualized.  Cleaned with Betadine x 2.  Grasped anteriorly with a single tooth tenaculum.  Uterus sounded to 8 cm.  Liletta IUD placed per manufacturer's recommendations.  Strings trimmed to 3 cm. Tenaculum was removed, good hemostasis noted.  Patient tolerated procedure well.   Patient was given post-procedure instructions.  She was advised to have backup contraception for one week.  Patient was also asked to check IUD strings periodically and follow up in 4 weeks for IUD check.   Emeterio Reeve, MD, [place] Attending [place], [facility] for [place] ID: Christy Hooper, female   DOB: 22-Jul-2000, 18 y.o.   MRN: [id number]

## 2018-10-26 NOTE — Patient Instructions (Signed)
Levonorgestrel intrauterine device (IUD) What is this medicine? LEVONORGESTREL IUD (LEE voe nor jes trel) is a contraceptive (birth control) device. The device is placed inside the uterus by a healthcare professional. It is used to prevent pregnancy. This device can also be used to treat heavy bleeding that occurs during your period. This medicine may be used for other purposes; ask your health care provider or pharmacist if you have questions. COMMON BRAND NAME(S): Kyleena, LILETTA, Mirena, Skyla What should I tell my health care provider before I take this medicine? They need to know if you have any of these conditions:  abnormal Pap smear  cancer of the breast, uterus, or cervix  diabetes  endometritis  genital or pelvic infection now or in the past  have more than one sexual partner or your partner has more than one partner  heart disease  history of an ectopic or tubal pregnancy  immune system problems  IUD in place  liver disease or tumor  problems with blood clots or take blood-thinners  seizures  use intravenous drugs  uterus of unusual shape  vaginal bleeding that has not been explained  an unusual or allergic reaction to levonorgestrel, other hormones, silicone, or polyethylene, medicines, foods, dyes, or preservatives  pregnant or trying to get pregnant  breast-feeding How should I use this medicine? This device is placed inside the uterus by a health care professional. Talk to your pediatrician regarding the use of this medicine in children. Special care may be needed. Overdosage: If you think you have taken too much of this medicine contact a poison control center or emergency room at once. NOTE: This medicine is only for you. Do not share this medicine with others. What if I miss a dose? This does not apply. Depending on the brand of device you have inserted, the device will need to be replaced every 3 to 6 years if you wish to continue using this type  of birth control. What may interact with this medicine? Do not take this medicine with any of the following medications:  amprenavir  bosentan  fosamprenavir This medicine may also interact with the following medications:  aprepitant  armodafinil  barbiturate medicines for inducing sleep or treating seizures  bexarotene  boceprevir  griseofulvin  medicines to treat seizures like carbamazepine, ethotoin, felbamate, oxcarbazepine, phenytoin, topiramate  modafinil  pioglitazone  rifabutin  rifampin  rifapentine  some medicines to treat HIV infection like atazanavir, efavirenz, indinavir, lopinavir, nelfinavir, tipranavir, ritonavir  St. John's wort  warfarin This list may not describe all possible interactions. Give your health care provider a list of all the medicines, herbs, non-prescription drugs, or dietary supplements you use. Also tell them if you smoke, drink alcohol, or use illegal drugs. Some items may interact with your medicine. What should I watch for while using this medicine? Visit your doctor or health care professional for regular check ups. See your doctor if you or your partner has sexual contact with others, becomes HIV positive, or gets a sexual transmitted disease. This product does not protect you against HIV infection (AIDS) or other sexually transmitted diseases. You can check the placement of the IUD yourself by reaching up to the top of your vagina with clean fingers to feel the threads. Do not pull on the threads. It is a good habit to check placement after each menstrual period. Call your doctor right away if you feel more of the IUD than just the threads or if you cannot feel the threads at   all. The IUD may come out by itself. You may become pregnant if the device comes out. If you notice that the IUD has come out use a backup birth control method like condoms and call your health care provider. Using tampons will not change the position of the  IUD and are okay to use during your period. This IUD can be safely scanned with magnetic resonance imaging (MRI) only under specific conditions. Before you have an MRI, tell your healthcare provider that you have an IUD in place, and which type of IUD you have in place. What side effects may I notice from receiving this medicine? Side effects that you should report to your doctor or health care professional as soon as possible:  allergic reactions like skin rash, itching or hives, swelling of the face, lips, or tongue  fever, flu-like symptoms  genital sores  high blood pressure  no menstrual period for 6 weeks during use  pain, swelling, warmth in the leg  pelvic pain or tenderness  severe or sudden headache  signs of pregnancy  stomach cramping  sudden shortness of breath  trouble with balance, talking, or walking  unusual vaginal bleeding, discharge  yellowing of the eyes or skin Side effects that usually do not require medical attention (report to your doctor or health care professional if they continue or are bothersome):  acne  breast pain  change in sex drive or performance  changes in weight  cramping, dizziness, or faintness while the device is being inserted  headache  irregular menstrual bleeding within first 3 to 6 months of use  nausea This list may not describe all possible side effects. Call your doctor for medical advice about side effects. You may report side effects to FDA at 1-800-FDA-1088. Where should I keep my medicine? This does not apply. NOTE: This sheet is a summary. It may not cover all possible information. If you have questions about this medicine, talk to your doctor, pharmacist, or health care provider.  2020 Elsevier/Gold Standard (2018-02-06 13:22:01)  

## 2018-10-26 NOTE — Telephone Encounter (Signed)
Called the patient to complete the pre-screen. The patient answered no to COVID19 symptoms and/or being previously diagnosed. Informed the patient of the wearing a face mask, sanitizing hands at the sanitizing station upon entering our office, and no visitors or children are allowed due to the COVID19 restrictions. The patient verbalized understanding. °

## 2018-10-26 NOTE — Progress Notes (Signed)
Pt reports last unprotected intercourse was >3 weeks ago. UPT negative today.

## 2018-12-21 ENCOUNTER — Ambulatory Visit (HOSPITAL_COMMUNITY)
Admission: EM | Admit: 2018-12-21 | Discharge: 2018-12-21 | Disposition: A | Discharge status: Home / Self Care | Payer: Medicaid Other | Attending: Emergency Medicine | Admitting: Emergency Medicine

## 2018-12-21 ENCOUNTER — Encounter (HOSPITAL_COMMUNITY): Payer: Self-pay | Admitting: Emergency Medicine

## 2018-12-21 DIAGNOSIS — J3489 Other specified disorders of nose and nasal sinuses: Secondary | ICD-10-CM

## 2018-12-21 DIAGNOSIS — Z20828 Contact with and (suspected) exposure to other viral communicable diseases: Secondary | ICD-10-CM | POA: Diagnosis not present

## 2018-12-21 DIAGNOSIS — F17210 Nicotine dependence, cigarettes, uncomplicated: Secondary | ICD-10-CM | POA: Diagnosis not present

## 2018-12-21 DIAGNOSIS — R0989 Other specified symptoms and signs involving the circulatory and respiratory systems: Secondary | ICD-10-CM | POA: Diagnosis not present

## 2018-12-21 DIAGNOSIS — R0981 Nasal congestion: Secondary | ICD-10-CM | POA: Insufficient documentation

## 2018-12-21 DIAGNOSIS — F17200 Nicotine dependence, unspecified, uncomplicated: Secondary | ICD-10-CM | POA: Diagnosis not present

## 2018-12-21 DIAGNOSIS — Z809 Family history of malignant neoplasm, unspecified: Secondary | ICD-10-CM | POA: Diagnosis not present

## 2018-12-21 DIAGNOSIS — R6889 Other general symptoms and signs: Secondary | ICD-10-CM | POA: Diagnosis present

## 2018-12-21 DIAGNOSIS — R05 Cough: Secondary | ICD-10-CM | POA: Diagnosis not present

## 2018-12-21 DIAGNOSIS — Z20822 Contact with and (suspected) exposure to covid-19: Secondary | ICD-10-CM

## 2018-12-21 DIAGNOSIS — R059 Cough, unspecified: Secondary | ICD-10-CM

## 2018-12-21 DIAGNOSIS — Z1159 Encounter for screening for other viral diseases: Secondary | ICD-10-CM

## 2018-12-21 NOTE — ED Triage Notes (Signed)
Pt c/o cough and congestion since Monday, recently went to the beach. Pt states her job requires her to get tested for covid before returning to work.

## 2018-12-21 NOTE — ED Provider Notes (Signed)
[facility]    CSN: [id number] Arrival date & time: 12/21/18  0841      History   Chief Complaint Chief Complaint  Patient presents with  . Cough    HPI Christy Hooper is a 18 y.o. female.   Patient presents with nonproductive cough and nasal congestion x4 days.  She states her symptoms began on 12/17/2018 while she was at the beach with her boyfriend.  She denies fever, chills, rash, sore throat, shortness of breath, vomiting, diarrhea, or other symptoms.  LMP: Irregular, on OCP, breast-feeding.  The history is provided by the patient.    Past Medical History:  Diagnosis Date  . Asthma    has not had an asthma attack or used inhaler since middle school    Patient Active Problem List   Diagnosis Date Noted  . Ovarian cyst 05/04/2018  . GBS bacteriuria 04/24/2018  . Mild asthma 03/06/2018    Past Surgical History:  Procedure Laterality Date  . TONSILLECTOMY    . WISDOM TOOTH EXTRACTION      OB History    Gravida  1   Para  0   Term  0   Preterm  0   AB  0   Living  0     SAB  0   TAB  0   Ectopic  0   Multiple  0   Live Births  0            Home Medications    Prior to Admission medications   Medication Sig Start Date End Date Taking? Authorizing Provider  acetaminophen (TYLENOL) 325 MG tablet Take 2 tablets (650 mg total) by mouth every 4 (four) hours as needed (for pain scale < 4). Patient not taking: Reported on 10/05/2018 09/03/18   Marylene LandKooistra, Kathryn Lorraine, CNM  ibuprofen (ADVIL) 600 MG tablet Take 1 tablet (600 mg total) by mouth every 6 (six) hours. Patient not taking: Reported on 10/26/2018 09/03/18   Marylene LandKooistra, Kathryn Lorraine, CNM  Misc. Devices (BREAST PUMP) MISC Electric double pump - Breast pump for client to use while breastfeeding. 07/11/18   Burleson, Brand Maleserri L, NP  Prenatal Vit-Fe Fumarate-FA (PRENATAL COMPLETE) 14-0.4 MG TABS Take 1 tablet by mouth daily. 12/26/17   Eyvonne MechanicHedges, Jeffrey, PA-C    Family History Family  History  Problem Relation Age of Onset  . Cancer Paternal Aunt   . Depression Maternal Grandmother   . Depression Maternal Grandfather   . Cancer Other     Social History Social History   Tobacco Use  . Smoking status: Current Some Day Smoker    Packs/day: 0.00    Types: Cigarettes    Last attempt to quit: 01/23/2018    Years since quitting: 0.9  . Smokeless tobacco: Never Used  . Tobacco comment: "few cigarettes per week"  Substance Use Topics  . Alcohol use: Not Currently    Comment: 1 bottle of liqour per week   . Drug use: Not Currently    Types: Marijuana    Comment: August 2019     Allergies   Patient has no known allergies.   Review of Systems Review of Systems  Constitutional: Negative for chills and fever.  HENT: Positive for congestion and rhinorrhea. Negative for ear pain and sore throat.   Eyes: Negative for pain and visual disturbance.  Respiratory: Positive for cough. Negative for shortness of breath.   Cardiovascular: Negative for chest pain and palpitations.  Gastrointestinal: Negative for abdominal pain and vomiting.  Genitourinary: Negative for dysuria and hematuria.  Musculoskeletal: Negative for arthralgias and back pain.  Skin: Negative for color change and rash.  Neurological: Negative for seizures and syncope.  All other systems reviewed and are negative.    Physical Exam Triage Vital Signs ED Triage Vitals  Enc Vitals Group     BP      Pulse      Resp      Temp      Temp src      SpO2      Weight      Height      Head Circumference      Peak Flow      Pain Score      Pain Loc      Pain Edu?      Excl. in GC?    No data found.  Updated Vital Signs BP (!) 102/48   Pulse 85   Temp 98.6 F (37 C)   Resp 18   SpO2 98%   Breastfeeding Yes   Visual Acuity Right Eye Distance:   Left Eye Distance:   Bilateral Distance:    Right Eye Near:   Left Eye Near:    Bilateral Near:     Physical Exam Vitals signs and nursing  note reviewed.  Constitutional:      General: She is not in acute distress.    Appearance: She is well-developed.  HENT:     Head: Normocephalic and atraumatic.     Right Ear: Tympanic membrane normal.     Left Ear: Tympanic membrane normal.     Nose: Congestion and rhinorrhea present.     Mouth/Throat:     Mouth: Mucous membranes are moist.     Pharynx: Oropharynx is clear.  Eyes:     Conjunctiva/sclera: Conjunctivae normal.  Neck:     Musculoskeletal: Neck supple.  Cardiovascular:     Rate and Rhythm: Normal rate and regular rhythm.     Heart sounds: No murmur.  Pulmonary:     Effort: Pulmonary effort is normal. No respiratory distress.     Breath sounds: Normal breath sounds.  Abdominal:     General: Bowel sounds are normal.     Palpations: Abdomen is soft.     Tenderness: There is no abdominal tenderness. There is no guarding or rebound.  Skin:    General: Skin is warm and dry.     Findings: No rash.  Neurological:     Mental Status: She is alert.      UC Treatments / Results  Labs (all labs ordered are listed, but only abnormal results are displayed) Labs Reviewed  NOVEL CORONAVIRUS, NAA (HOSP ORDER, SEND-OUT TO REF LAB; TAT 18-24 HRS)    EKG   Radiology No results found.  Procedures Procedures (including critical care time)  Medications Ordered in UC Medications - No data to display  Initial Impression / Assessment and Plan / UC Course  I have reviewed the triage vital signs and the nursing notes.  Pertinent labs & imaging results that were available during my care of the patient were reviewed by me and considered in my medical decision making (see chart for details).     Cough, suspected COVID.  Instructed patient to take Mucinex as needed for her cough and congestion.  COVID test performed here.  Instructed patient to self quarantine until her test results are back.  Instructed patient to go to the emergency department if she develops high fever,  shortness of breath, severe diarrhea, or other concerning symptoms.  Patient agrees with plan of care.     Final Clinical Impressions(s) / UC Diagnoses   Final diagnoses:  Cough  Suspected Covid-19 Virus Infection     Discharge Instructions     Your COVID test is pending.  You should self quarantine until your test result is back and is negative.    Go to the emergency department if you develop shortness of breath, high fever, severe diarrhea, or other concerning symptoms.        ED Prescriptions    None     Controlled Substance Prescriptions [place] Controlled Substance Registry consulted? Not Applicable   Sharion Balloon, NP 12/21/18 [phone]

## 2018-12-21 NOTE — Discharge Instructions (Addendum)
Your COVID test is pending.  You should self quarantine until your test result is back and is negative.   ° °Go to the emergency department if you develop shortness of breath, high fever, severe diarrhea, or other concerning symptoms.   ° ° ° °

## 2018-12-23 LAB — NOVEL CORONAVIRUS, NAA (HOSP ORDER, SEND-OUT TO REF LAB; TAT 18-24 HRS): SARS-CoV-2, NAA: NOT DETECTED

## 2018-12-24 ENCOUNTER — Encounter (HOSPITAL_COMMUNITY): Payer: Self-pay

## 2019-01-24 IMAGING — DX DG FOOT COMPLETE 3+V*R*
3 series · 3 of 3 positions shown · non-contrast
Comparison: None.

CLINICAL DATA: Fall, rolled right ankle, swelling and pain, initial
encounter.

EXAM:
RIGHT FOOT COMPLETE - 3+ VIEW

[x foot lat right]
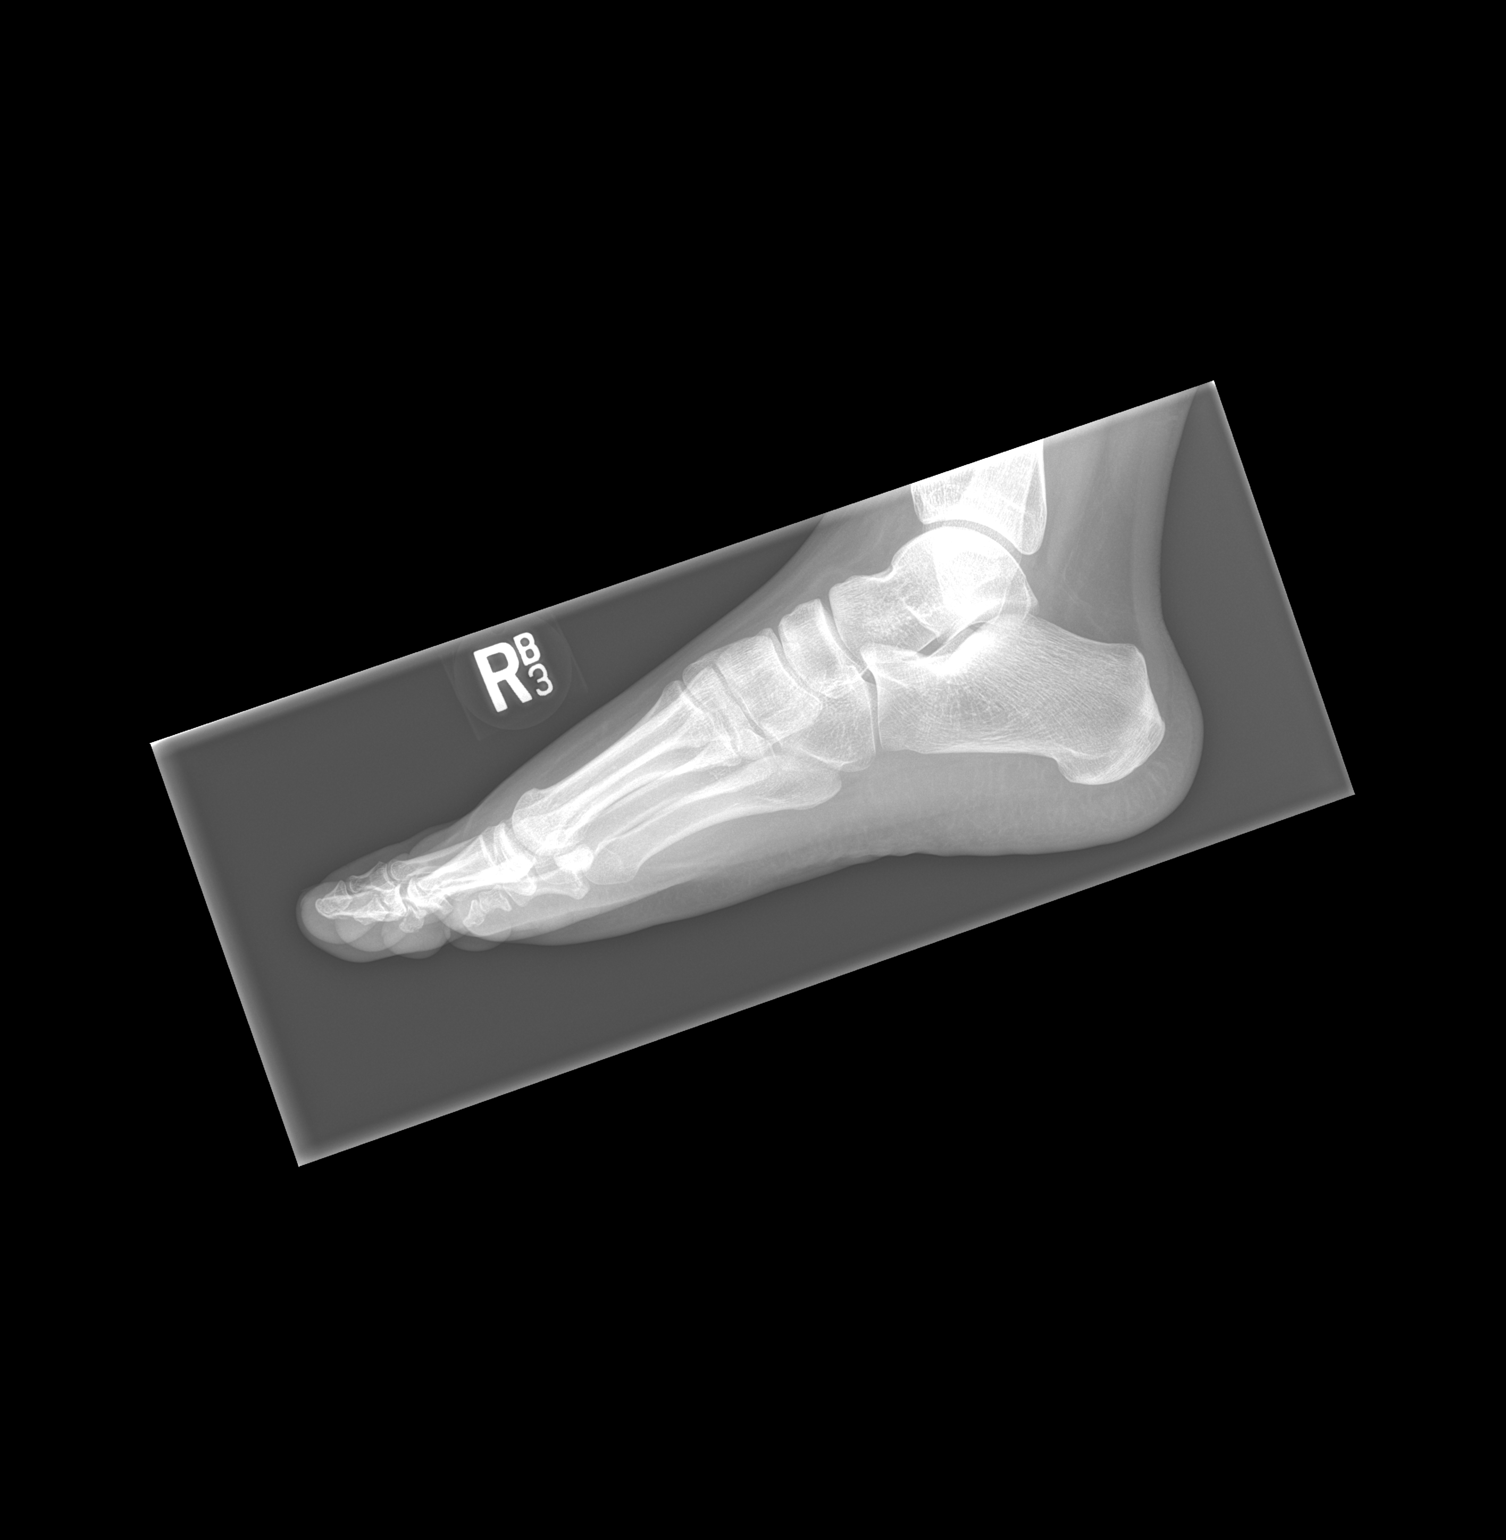

[x foot obl right]
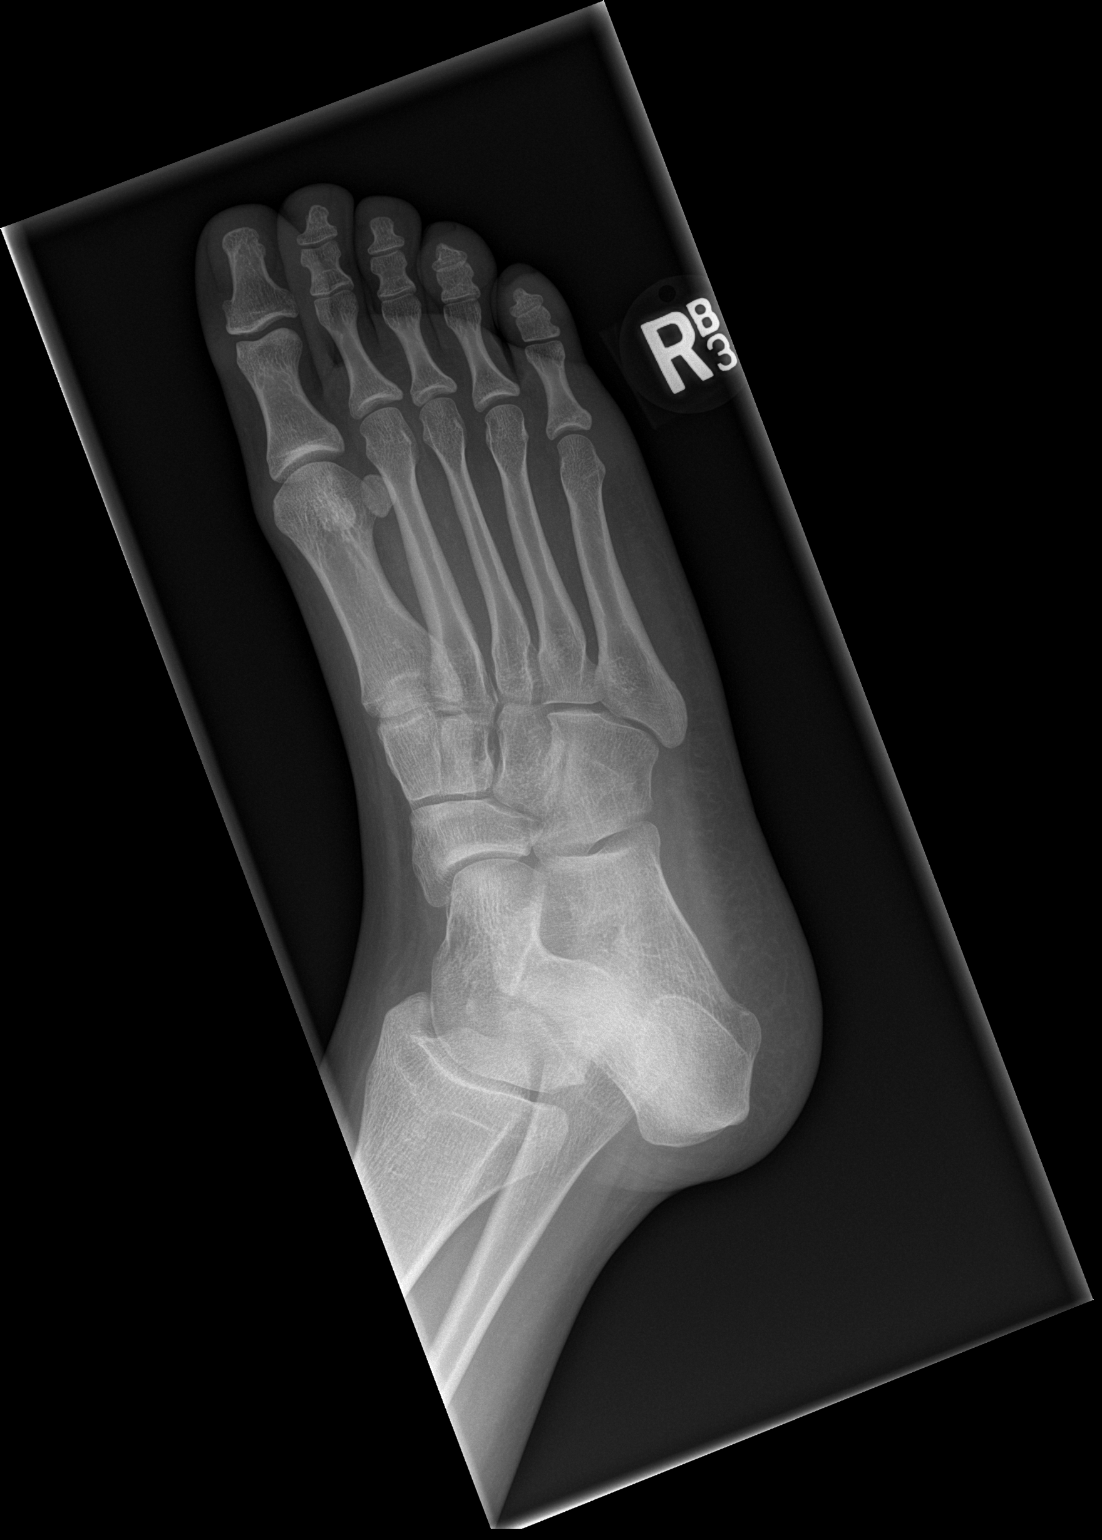

[x foot ap right]
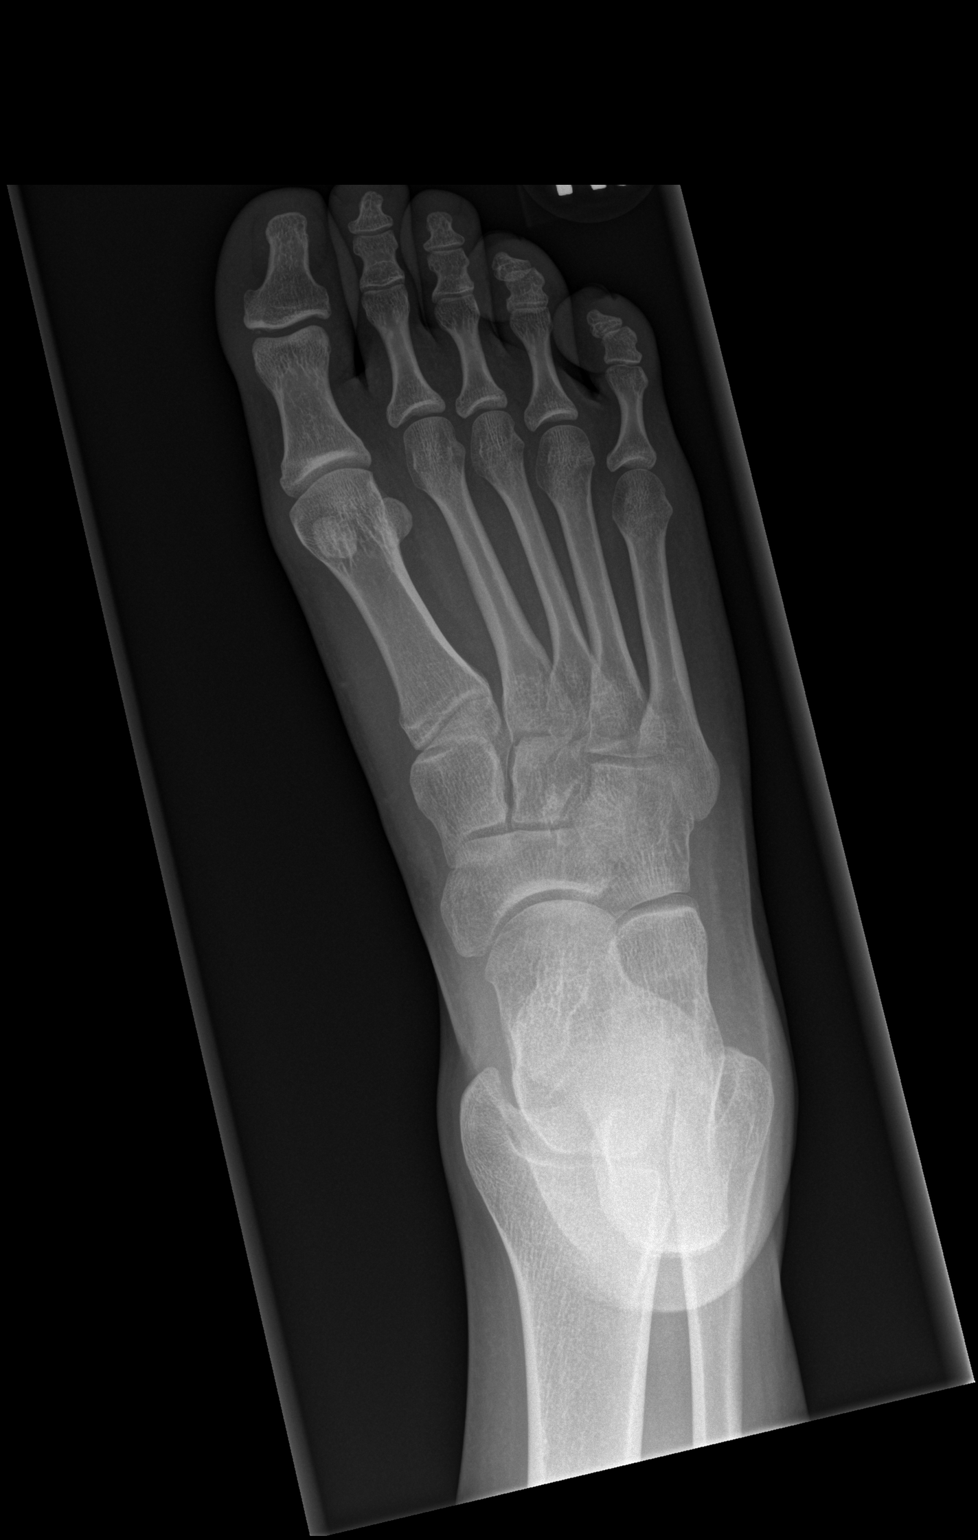

[3 of 3 positions shown; findings below may reference images not displayed]

FINDINGS: No acute osseous or joint abnormality.
IMPRESSION: No acute osseous or joint abnormality.
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
## 2019-03-25 ENCOUNTER — Telehealth: Payer: Self-pay

## 2019-03-25 DIAGNOSIS — N939 Abnormal uterine and vaginal bleeding, unspecified: Secondary | ICD-10-CM

## 2019-03-25 DIAGNOSIS — N946 Dysmenorrhea, unspecified: Secondary | ICD-10-CM

## 2019-03-25 MED ORDER — NAPROXEN 500 MG PO TABS: 500.0000 mg | ORAL_TABLET | Freq: Two times a day (BID) | ORAL | 1 refills | Status: DC

## 2019-03-25 MED ORDER — TRANEXAMIC ACID 650 MG PO TABS: 1300.0000 mg | ORAL_TABLET | Freq: Three times a day (TID) | ORAL | 0 refills | Status: DC

## 2019-03-25 NOTE — Telephone Encounter (Signed)
Pt called and spoke with front office staff who transferred call to clinical staff.   Pt states she had IUD placed in July 2020 and has been having regular periods with manageable pain since that time until her last period. Pt states her last period lasted for 7 days and was very painful. Reports the bleeding stopped for 1 day and then continued. Pt is experiencing unilateral lower abdominal pain. Pt expressed concern she may be pregnant.   Reviewed pt hx and symptoms with Anyanwu, MD who states pt may take a pregnancy test and let us know if she is pregnant. Also, verbal order given for Lysteda and Naproxen to manage bleeding. Prescriptions sent to pt's preferred pharmacy.  Explained to pt the provider's recommendation and encouraged her to contact us if pregnancy test is positive or if bleeding/pain continues after completing Lysteda. Pt notified of need to present to ED if she is saturating 1 pad/hr over several hours or if pain becomes severe. Pt verbalizes understanding.

## 2019-03-27 ENCOUNTER — Telehealth (INDEPENDENT_AMBULATORY_CARE_PROVIDER_SITE_OTHER): Payer: Medicaid Other | Admitting: Obstetrics & Gynecology

## 2019-03-27 DIAGNOSIS — N946 Dysmenorrhea, unspecified: Secondary | ICD-10-CM

## 2019-03-27 DIAGNOSIS — N939 Abnormal uterine and vaginal bleeding, unspecified: Secondary | ICD-10-CM

## 2019-03-27 NOTE — Telephone Encounter (Signed)
Patient called and stated her test was negative, and she is requesting her Rx be sent to a different pharmacy. She is out of town.

## 2019-03-29 MED ORDER — NAPROXEN 500 MG PO TABS: 500.0000 mg | ORAL_TABLET | Freq: Two times a day (BID) | ORAL | 1 refills | Status: DC

## 2019-03-29 MED ORDER — TRANEXAMIC ACID 650 MG PO TABS: 1300.0000 mg | ORAL_TABLET | Freq: Three times a day (TID) | ORAL | 0 refills | Status: DC

## 2019-03-29 NOTE — Telephone Encounter (Signed)
Pt called front office and requested medication be sent to a different pharmacy.  Called pt. Pt gave new pharmacy. Naproxen and Lysteda rx sent to new pharmacy. Explained to pt she will need to take the Naproxen with a meal. Pt verbalizes understanding and has no other concerns.

## 2019-03-29 NOTE — Addendum Note (Signed)
Addended by: Annabell Howells on: 03/29/2019 08:53 AM   Modules accepted: Orders
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
## 2019-12-30 ENCOUNTER — Ambulatory Visit (HOSPITAL_COMMUNITY)
Admission: EM | Admit: 2019-12-30 | Discharge: 2019-12-30 | Disposition: A | Discharge status: Home / Self Care | Payer: Medicaid Other | Attending: Internal Medicine | Admitting: Internal Medicine

## 2019-12-30 ENCOUNTER — Encounter (HOSPITAL_COMMUNITY): Payer: Self-pay

## 2019-12-30 ENCOUNTER — Ambulatory Visit (INDEPENDENT_AMBULATORY_CARE_PROVIDER_SITE_OTHER): Payer: Medicaid Other

## 2019-12-30 ENCOUNTER — Other Ambulatory Visit: Payer: Self-pay

## 2019-12-30 DIAGNOSIS — K5900 Constipation, unspecified: Secondary | ICD-10-CM

## 2019-12-30 DIAGNOSIS — K5901 Slow transit constipation: Secondary | ICD-10-CM | POA: Diagnosis not present

## 2019-12-30 DIAGNOSIS — R109 Unspecified abdominal pain: Secondary | ICD-10-CM | POA: Diagnosis not present

## 2019-12-30 MED ORDER — SENNOSIDES-DOCUSATE SODIUM 8.6-50 MG PO TABS: 1.0000 | ORAL_TABLET | Freq: Two times a day (BID) | ORAL | 0 refills | Status: AC

## 2019-12-30 MED ORDER — POLYETHYLENE GLYCOL 3350 17 G PO PACK: 17.0000 g | PACK | Freq: Every day | ORAL | 0 refills | Status: DC | PRN

## 2019-12-30 NOTE — Discharge Instructions (Signed)
Increase oral fluid intake Increase fruit and vegetable intake Hold medications for constipation Return to urgent care if you notice worsening abdominal pain, distention, persistent nausea/vomiting.

## 2019-12-30 NOTE — ED Provider Notes (Signed)
[facility]    CSN: [id number] Arrival date & time: 12/30/19  1106      History   Chief Complaint Chief Complaint  Patient presents with  . Abdominal Pain  . Constipation    HPI Christy Hooper is a 19 y.o. female comes to the urgent care with complaints of abdominal pain of 2 weeks duration.  Pain started insidiously and is gotten progressively worse.  Last bowel movement was several days ago.  Patient has a history of constipation but advised not take any medications for that.  No abdominal distention.  She complains of abdominal fullness and nausea but no vomiting.  No trauma to the abdomen.   HPI  Past Medical History:  Diagnosis Date  . Asthma    has not had an asthma attack or used inhaler since middle school    Patient Active Problem List   Diagnosis Date Noted  . Ovarian cyst 05/04/2018  . GBS bacteriuria 04/24/2018  . Mild asthma 03/06/2018    Past Surgical History:  Procedure Laterality Date  . TONSILLECTOMY    . WISDOM TOOTH EXTRACTION      OB History    Gravida  1   Para  0   Term  0   Preterm  0   AB  0   Living  0     SAB  0   TAB  0   Ectopic  0   Multiple  0   Live Births  0            Home Medications    Prior to Admission medications   Medication Sig Start Date End Date Taking? Authorizing Provider  polyethylene glycol (MIRALAX) 17 g packet Take 17 g by mouth daily as needed for moderate constipation. 12/30/19   Montrey Buist, Britta Mccreedy, MD  senna-docusate (SENOKOT-S) 8.6-50 MG tablet Take 1 tablet by mouth 2 (two) times daily. 12/30/19 01/29/20  Merrilee Jansky, MD    Family History Family History  Problem Relation Age of Onset  . Cancer Paternal Aunt   . Depression Maternal Grandmother   . Depression Maternal Grandfather   . Cancer Other     Social History Social History   Tobacco Use  . Smoking status: Current Some Day Smoker    Packs/day: 0.00    Types: Cigarettes    Last attempt to quit: 01/23/2018      Years since quitting: 1.9  . Smokeless tobacco: Never Used  . Tobacco comment: "few cigarettes per week"  Vaping Use  . Vaping Use: Never used  Substance Use Topics  . Alcohol use: Not Currently    Comment: 1 bottle of liqour per week   . Drug use: Not Currently    Types: Marijuana    Comment: August 2019     Allergies   Patient has no known allergies.   Review of Systems Review of Systems  Respiratory: Negative for cough and shortness of breath.   Gastrointestinal: Positive for abdominal pain, constipation and nausea. Negative for diarrhea and vomiting.     Physical Exam Triage Vital Signs ED Triage Vitals  Enc Vitals Group     BP 12/30/19 1258 121/74     Pulse Rate 12/30/19 1258 67     Resp 12/30/19 1258 16     Temp 12/30/19 1258 98.8 F (37.1 C)     Temp Source 12/30/19 1258 Oral     SpO2 12/30/19 1258 99 %     Weight --  Height --      Head Circumference --      Peak Flow --      Pain Score 12/30/19 1257 7     Pain Loc --      Pain Edu? --      Excl. in GC? --    No data found.  Updated Vital Signs BP 121/74 (BP Location: Right Arm)   Pulse 67   Temp 98.8 F (37.1 C) (Oral)   Resp 16   LMP  (Within Weeks) Comment: 2 weeks  SpO2 99%   Breastfeeding No   Visual Acuity Right Eye Distance:   Left Eye Distance:   Bilateral Distance:    Right Eye Near:   Left Eye Near:    Bilateral Near:     Physical Exam Vitals and nursing note reviewed.  Constitutional:      Appearance: She is well-developed.  Abdominal:     General: Abdomen is flat. Bowel sounds are normal. There is no distension or abdominal bruit. There are no signs of injury.     Palpations: Abdomen is soft. There is no hepatomegaly, splenomegaly or pulsatile mass.     Tenderness: There is abdominal tenderness in the right lower quadrant, suprapubic area and left lower quadrant. There is no guarding or rebound.     Hernia: No hernia is present.  Neurological:     Mental Status:  She is alert.      UC Treatments / Results  Labs (all labs ordered are listed, but only abnormal results are displayed) Labs Reviewed - No data to display  EKG   Radiology DG Abd 1 View  Result Date: 12/30/2019 CLINICAL DATA:  Abdominal pain.  Constipation. EXAM: ABDOMEN - 1 VIEW COMPARISON:  None FINDINGS: Single view of the abdomen was obtained. Moderate amount of stool in the abdomen. Nonobstructive bowel gas pattern. Pelvic calcifications are suggestive for phleboliths. Bone structures are unremarkable. IMPRESSION: Moderate stool burden.  Normal bowel gas pattern. Electronically Signed   By: Richarda Overlie M.D.   On: 12/30/2019 13:38    Procedures Procedures (including critical care time)  Medications Ordered in UC Medications - No data to display  Initial Impression / Assessment and Plan / UC Course  I have reviewed the triage vital signs and the nursing notes.  Pertinent labs & imaging results that were available during my care of the patient were reviewed by me and considered in my medical decision making (see chart for details).     1.  Constipation: Senokot 1 tablet twice daily MiraLAX 17 g orally daily KUB is remarkable for moderate stool burden with normal bowel gas pattern Patient is encouraged to increase fiber intake If abdominal pain worsens or patient develops abdominal distention-please advised to return to the urgent care for further evaluation Final Clinical Impressions(s) / UC Diagnoses   Final diagnoses:  Slow transit constipation     Discharge Instructions     Increase oral fluid intake Increase fruit and vegetable intake Hold medications for constipation Return to urgent care if you notice worsening abdominal pain, distention, persistent nausea/vomiting.    ED Prescriptions    Medication Sig Dispense Auth. Provider   senna-docusate (SENOKOT-S) 8.6-50 MG tablet Take 1 tablet by mouth 2 (two) times daily. 60 tablet Keonda Dow, Britta Mccreedy, MD    polyethylene glycol (MIRALAX) 17 g packet Take 17 g by mouth daily as needed for moderate constipation. 14 each Lawson Isabell, Britta Mccreedy, MD     PDMP not reviewed this encounter.  Merrilee Jansky, MD 12/31/19 2104

## 2019-12-30 NOTE — ED Triage Notes (Signed)
Pt presents lower abdominal pain and constipation x 2 week. Pt states the abdominal pain feels the same when she had an ovarian cyst in Feb 2021 and had a surgery.

## 2020-02-11 ENCOUNTER — Other Ambulatory Visit: Payer: Self-pay

## 2020-02-11 ENCOUNTER — Encounter (HOSPITAL_COMMUNITY): Payer: Self-pay | Admitting: Emergency Medicine

## 2020-02-11 ENCOUNTER — Ambulatory Visit (HOSPITAL_COMMUNITY)
Admission: EM | Admit: 2020-02-11 | Discharge: 2020-02-11 | Disposition: A | Discharge status: Home / Self Care | Payer: Medicaid Other | Attending: Family Medicine | Admitting: Family Medicine

## 2020-02-11 DIAGNOSIS — S29012A Strain of muscle and tendon of back wall of thorax, initial encounter: Secondary | ICD-10-CM

## 2020-02-11 MED ORDER — CYCLOBENZAPRINE HCL 10 MG PO TABS: ORAL_TABLET | ORAL | 0 refills | Status: DC

## 2020-02-11 MED ORDER — DICLOFENAC SODIUM 75 MG PO TBEC: 75.0000 mg | DELAYED_RELEASE_TABLET | Freq: Two times a day (BID) | ORAL | 0 refills | Status: DC
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
## 2020-04-21 ENCOUNTER — Other Ambulatory Visit: Payer: Medicaid Other

## 2020-04-21 ENCOUNTER — Other Ambulatory Visit: Payer: Self-pay

## 2020-04-21 DIAGNOSIS — Z20822 Contact with and (suspected) exposure to covid-19: Secondary | ICD-10-CM

## 2020-04-25 LAB — NOVEL CORONAVIRUS, NAA: SARS-CoV-2, NAA: NOT DETECTED

## 2020-06-12 ENCOUNTER — Encounter (HOSPITAL_COMMUNITY): Payer: Self-pay | Admitting: Emergency Medicine

## 2022-07-29 ENCOUNTER — Emergency Department (HOSPITAL_COMMUNITY)
Admission: EM | Admit: 2022-07-29 | Discharge: 2022-07-29 | Disposition: A | Discharge status: Home / Self Care | Payer: Medicaid Other | Attending: Emergency Medicine | Admitting: Emergency Medicine

## 2022-07-29 ENCOUNTER — Encounter (HOSPITAL_COMMUNITY): Payer: Self-pay

## 2022-07-29 ENCOUNTER — Other Ambulatory Visit: Payer: Self-pay

## 2022-07-29 DIAGNOSIS — W504XXA Accidental scratch by another person, initial encounter: Secondary | ICD-10-CM | POA: Insufficient documentation

## 2022-07-29 DIAGNOSIS — H5711 Ocular pain, right eye: Secondary | ICD-10-CM | POA: Diagnosis present

## 2022-07-29 DIAGNOSIS — S0501XA Injury of conjunctiva and corneal abrasion without foreign body, right eye, initial encounter: Secondary | ICD-10-CM | POA: Insufficient documentation

## 2022-07-29 DIAGNOSIS — J45909 Unspecified asthma, uncomplicated: Secondary | ICD-10-CM | POA: Diagnosis not present

## 2022-07-29 MED ORDER — FLUORESCEIN SODIUM 1 MG OP STRP
2.0000 | ORAL_STRIP | Freq: Once | OPHTHALMIC | Status: AC
  Administered 2022-07-29: 2 via OPHTHALMIC
  Filled 2022-07-29: qty 2

## 2022-07-29 MED ORDER — OFLOXACIN 0.3 % OP SOLN: 2.0000 [drp] | Freq: Four times a day (QID) | OPHTHALMIC | 0 refills | Status: DC

## 2022-07-29 MED ORDER — TETRACAINE HCL 0.5 % OP SOLN
2.0000 [drp] | Freq: Once | OPHTHALMIC | Status: AC
  Administered 2022-07-29: 2 [drp] via OPHTHALMIC
  Filled 2022-07-29: qty 4
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
## 2022-07-29 NOTE — Discharge Instructions (Signed)
You were diagnosed today with a small corneal abrasion. Please place 2 drops of the prescribed antibiotic eye drops in the right eye twice daily. Follow up as needed with ophthalmology
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
## 2022-11-09 DIAGNOSIS — Z3042 Encounter for surveillance of injectable contraceptive: Secondary | ICD-10-CM | POA: Diagnosis not present

## 2022-12-09 ENCOUNTER — Emergency Department (HOSPITAL_COMMUNITY)
Admission: EM | Admit: 2022-12-09 | Discharge: 2022-12-10 | Disposition: A | Discharge status: Home / Self Care | Payer: Medicaid Other | Attending: Emergency Medicine | Admitting: Emergency Medicine

## 2022-12-09 ENCOUNTER — Other Ambulatory Visit: Payer: Self-pay

## 2022-12-09 ENCOUNTER — Encounter (HOSPITAL_COMMUNITY): Payer: Self-pay | Admitting: *Deleted

## 2022-12-09 DIAGNOSIS — S0993XA Unspecified injury of face, initial encounter: Secondary | ICD-10-CM | POA: Diagnosis present

## 2022-12-09 DIAGNOSIS — X58XXXA Exposure to other specified factors, initial encounter: Secondary | ICD-10-CM | POA: Insufficient documentation

## 2022-12-09 DIAGNOSIS — S41012A Laceration without foreign body of left shoulder, initial encounter: Secondary | ICD-10-CM | POA: Insufficient documentation

## 2022-12-09 DIAGNOSIS — Z23 Encounter for immunization: Secondary | ICD-10-CM | POA: Diagnosis not present

## 2022-12-09 DIAGNOSIS — S0181XA Laceration without foreign body of other part of head, initial encounter: Secondary | ICD-10-CM

## 2022-12-09 DIAGNOSIS — Y990 Civilian activity done for income or pay: Secondary | ICD-10-CM | POA: Insufficient documentation

## 2022-12-09 DIAGNOSIS — S41112A Laceration without foreign body of left upper arm, initial encounter: Secondary | ICD-10-CM
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
## 2022-12-10 MED ORDER — LIDOCAINE-EPINEPHRINE (PF) 2 %-1:200000 IJ SOLN
20.0000 mL | Freq: Once | INTRAMUSCULAR | Status: AC
  Administered 2022-12-10: 20 mL
  Filled 2022-12-10: qty 20

## 2022-12-10 MED ORDER — LIDOCAINE HCL (PF) 1 % IJ SOLN
20.0000 mL | Freq: Once | INTRAMUSCULAR | Status: DC
  Filled 2022-12-10: qty 20

## 2022-12-10 MED ORDER — TETANUS-DIPHTH-ACELL PERTUSSIS 5-2.5-18.5 LF-MCG/0.5 IM SUSY
0.5000 mL | PREFILLED_SYRINGE | Freq: Once | INTRAMUSCULAR | Status: AC
  Administered 2022-12-10: 0.5 mL via INTRAMUSCULAR
  Filled 2022-12-10: qty 0.5

## 2022-12-10 MED ORDER — NAPROXEN 500 MG PO TABS: 500.0000 mg | ORAL_TABLET | Freq: Two times a day (BID) | ORAL | 0 refills | Status: DC

## 2022-12-10 MED ORDER — HYDROCODONE-ACETAMINOPHEN 5-325 MG PO TABS
1.0000 | ORAL_TABLET | Freq: Once | ORAL | Status: AC
  Administered 2022-12-10: 1 via ORAL
  Filled 2022-12-10: qty 1

## 2022-12-10 MED ORDER — CEPHALEXIN 500 MG PO CAPS: 500.0000 mg | ORAL_CAPSULE | Freq: Four times a day (QID) | ORAL | 0 refills | Status: DC

## 2022-12-10 NOTE — Discharge Instructions (Signed)
You were seen in the emergency room today for laceration.  Your laceration was repaired please, follow-up to get suture removal in 7 days.   You have 24 sutures on your face. To have 5 sutures on your left arm.  I sent Keflex which is an antibiotic and naproxen which is an anti-inflammatory and pain medicine to your pharmacy please pick up and take as prescribed.  Take naproxen with food.  If you continue to have pain even with the naproxen, I recommend alternating with Tylenol.  You can also use ice for better pain control.   Return to the emergency room if there are signs of infection, new or worsening symptoms.
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
## 2022-12-16 ENCOUNTER — Ambulatory Visit (HOSPITAL_COMMUNITY)
Admission: EM | Admit: 2022-12-16 | Discharge: 2022-12-16 | Disposition: A | Discharge status: Home / Self Care | Payer: Medicaid Other | Attending: Emergency Medicine | Admitting: Emergency Medicine

## 2022-12-16 ENCOUNTER — Encounter (HOSPITAL_COMMUNITY): Payer: Self-pay

## 2022-12-16 DIAGNOSIS — Z4802 Encounter for removal of sutures: Secondary | ICD-10-CM | POA: Diagnosis not present

## 2022-12-16 MED ORDER — BACITRACIN ZINC 500 UNIT/GM EX OINT
TOPICAL_OINTMENT | CUTANEOUS | Status: AC
  Filled 2022-12-16: qty 0.9

## 2022-12-16 NOTE — ED Triage Notes (Signed)
Patient presents to office for suture removal. Pt denies any pain or odor from incision.

## 2022-12-16 NOTE — Discharge Instructions (Addendum)
We have removed the 5 sutures in your left eyebrow, and 19 sutures in your left cheek. You can make an appointment and return to have the sutures removed in your left arm in two days.  Continue to apply the antibiotic ointment and keep the area clean and dry.  Return to clinic or seek follow-up care if you develop any purulent drainage, swelling, warmth, fever, or any new concerning signs of infection.

## 2022-12-16 NOTE — ED Provider Notes (Signed)
[facility]    CSN: [id number] Arrival date & time: 12/16/22  1722      History   Chief Complaint Chief Complaint  Patient presents with   Suture / Staple Removal    HPI Jenaya Mohammad is a 22 y.o. female.   Patient presents to clinic for suture removal.  She had 24 sutures placed between her left eyebrow and cheek on 8/30 at the ED. Was placed on oral antibiotics, reports compliance.  Denies any issues.  She also has 5 sutures in her left upper arm that need to be removed in 2 days.  The history is provided by the patient and medical records.  Suture / Staple Removal    Past Medical History:  Diagnosis Date   Asthma    has not had an asthma attack or used inhaler since middle school    Patient Active Problem List   Diagnosis Date Noted   Ovarian cyst 05/04/2018   GBS bacteriuria 04/24/2018   Mild asthma 03/06/2018    Past Surgical History:  Procedure Laterality Date   OOPHORECTOMY Right    TONSILLECTOMY     WISDOM TOOTH EXTRACTION      OB History     Gravida  1   Para  0   Term  0   Preterm  0   AB  0   Living         SAB  0   IAB  0   Ectopic  0   Multiple      Live Births               Home Medications    Prior to Admission medications   Medication Sig Start Date End Date Taking? Authorizing Provider  cephALEXin (KEFLEX) 500 MG capsule Take 1 capsule (500 mg total) by mouth 4 (four) times daily. 12/10/22   Barrett, Horald Chestnut, PA-C  cyclobenzaprine (FLEXERIL) 10 MG tablet Take 1 tablet by mouth 3 times daily as needed for muscle spasm. Warning: May cause drowsiness. 02/11/20   Mardella Layman, MD  diclofenac (VOLTAREN) 75 MG EC tablet Take 1 tablet (75 mg total) by mouth 2 (two) times daily. 02/11/20   Mardella Layman, MD  naproxen (NAPROSYN) 500 MG tablet Take 1 tablet (500 mg total) by mouth 2 (two) times daily. 12/10/22   Barrett, Horald Chestnut, PA-C  ofloxacin (OCUFLOX) 0.3 % ophthalmic solution Place 2 drops into the right eye 4  (four) times daily. 07/29/22   Darrick Grinder, PA-C    Family History Family History  Problem Relation Age of Onset   Cancer Paternal Aunt    Depression Maternal Grandmother    Depression Maternal Grandfather    Cancer Other     Social History Social History   Tobacco Use   Smoking status: Some Days    Current packs/day: 0.00    Types: Cigarettes    Last attempt to quit: 01/23/2018    Years since quitting: 4.8   Smokeless tobacco: Never   Tobacco comments:    "few cigarettes per week"  Vaping Use   Vaping status: Some Days  Substance Use Topics   Alcohol use: Yes   Drug use: Yes    Types: Marijuana    Comment: August 2019     Allergies   Patient has no known allergies.   Review of Systems Review of Systems  Skin:  Positive for wound.     Physical Exam Triage Vital Signs ED Triage Vitals [12/16/22 1804]  Encounter Vitals Group     BP 94/63     Systolic BP Percentile      Diastolic BP Percentile      Pulse Rate 85     Resp 16     Temp 99 F (37.2 C)     Temp Source Oral     SpO2 98 %     Weight      Height      Head Circumference      Peak Flow      Pain Score      Pain Loc      Pain Education      Exclude from Growth Chart    No data found.  Updated Vital Signs BP 94/63 (BP Location: Left Arm)   Pulse 85   Temp 99 F (37.2 C) (Oral)   Resp 16   SpO2 98%   Visual Acuity Right Eye Distance:   Left Eye Distance:   Bilateral Distance:    Right Eye Near:   Left Eye Near:    Bilateral Near:     Physical Exam Vitals and nursing note reviewed.  Constitutional:      Appearance: Normal appearance.  HENT:     Head: Normocephalic.     Right Ear: External ear normal.     Left Ear: External ear normal.     Nose: Nose normal.     Mouth/Throat:     Mouth: Mucous membranes are moist.  Eyes:     Conjunctiva/sclera: Conjunctivae normal.  Cardiovascular:     Rate and Rhythm: Normal rate.  Pulmonary:     Effort: Pulmonary effort is  normal. No respiratory distress.  Musculoskeletal:        General: Normal range of motion.  Skin:    General: Skin is warm and dry.  Neurological:     General: No focal deficit present.     Mental Status: She is alert and oriented to person, place, and time.  Psychiatric:        Mood and Affect: Mood normal.        Behavior: Behavior normal.      UC Treatments / Results  Labs (all labs ordered are listed, but only abnormal results are displayed) Labs Reviewed - No data to display  EKG   Radiology No results found.  Procedures Procedures (including critical care time)  Medications Ordered in UC Medications - No data to display  Initial Impression / Assessment and Plan / UC Course  I have reviewed the triage vital signs and the nursing notes.  Pertinent labs & imaging results that were available during my care of the patient were reviewed by me and considered in my medical decision making (see chart for details).  Vitals and triage reviewed, patient is hemodynamically stable.  Has 5 sutures in left eyebrow and 19 in her left cheek, edges are well-approximated and no apparent signs of infection.  5 sutures in left upper arm appear to be healing well, to be removed in 2 days.  Staff removed sutures, all are present.  Wound care discussed.  Plan of care, follow-up care and return precautions given, no questions at this time.    Final Clinical Impressions(s) / UC Diagnoses   Final diagnoses:  Visit for suture removal     Discharge Instructions      We have removed the 5 sutures in your left eyebrow, and 19 sutures in your left cheek. You can make an appointment and return to have  the sutures removed in your left arm in two days.  Continue to apply the antibiotic ointment and keep the area clean and dry.  Return to clinic or seek follow-up care if you develop any purulent drainage, swelling, warmth, fever, or any new concerning signs of infection.     ED  Prescriptions   None    PDMP not reviewed this encounter.   Lorey Pallett, [place], [place] 12/16/22 1901

## 2022-12-20 ENCOUNTER — Encounter (HOSPITAL_COMMUNITY): Payer: Self-pay

## 2022-12-20 ENCOUNTER — Ambulatory Visit (HOSPITAL_COMMUNITY)
Admission: EM | Admit: 2022-12-20 | Discharge: 2022-12-20 | Disposition: A | Discharge status: Home / Self Care | Payer: Medicaid Other | Attending: Nurse Practitioner | Admitting: Nurse Practitioner

## 2022-12-20 DIAGNOSIS — Z4802 Encounter for removal of sutures: Secondary | ICD-10-CM | POA: Diagnosis not present

## 2022-12-20 NOTE — ED Triage Notes (Signed)
Patient here today to have sutures removed from the back of her left upper arm that were placed at the ED last Friday.

## 2022-12-20 NOTE — Discharge Instructions (Signed)
We removed all 5 stitches from your left arm today.  Continue to keep the area clean and dry.  You can apply Vaseline or Aquaphor over the cuts until it fully heals.  Try not to pick at the area or itch at the area.  Seek care if the area starts draining pus.
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
## 2023-01-07 ENCOUNTER — Ambulatory Visit (HOSPITAL_COMMUNITY)
Admission: EM | Admit: 2023-01-07 | Discharge: 2023-01-07 | Disposition: A | Discharge status: Home / Self Care | Payer: Medicaid Other | Attending: Physician Assistant | Admitting: Physician Assistant

## 2023-01-07 ENCOUNTER — Encounter (HOSPITAL_COMMUNITY): Payer: Self-pay | Admitting: Emergency Medicine

## 2023-01-07 DIAGNOSIS — M25512 Pain in left shoulder: Secondary | ICD-10-CM | POA: Diagnosis not present

## 2023-01-07 MED ORDER — HYDROCODONE-ACETAMINOPHEN 5-325 MG PO TABS
1.0000 | ORAL_TABLET | Freq: Once | ORAL | Status: AC
  Administered 2023-01-07: 1 via ORAL

## 2023-01-07 MED ORDER — HYDROCODONE-ACETAMINOPHEN 5-325 MG PO TABS
ORAL_TABLET | ORAL | Status: AC
  Filled 2023-01-07: qty 1

## 2023-01-07 NOTE — Discharge Instructions (Signed)
Unfortunately at this time given your severity of symptoms, we do feel it best for you to be evaluated at the emergency department. Please have your friend take you there at this time. If you do not have someone here able to drive you, we can arrange transportation via EMS.
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
## 2023-07-27 ENCOUNTER — Encounter (HOSPITAL_COMMUNITY): Payer: Self-pay

## 2023-07-27 ENCOUNTER — Ambulatory Visit (HOSPITAL_COMMUNITY)
Admission: EM | Admit: 2023-07-27 | Discharge: 2023-07-27 | Disposition: A | Discharge status: Home / Self Care | Attending: Nurse Practitioner | Admitting: Nurse Practitioner

## 2023-07-27 DIAGNOSIS — J309 Allergic rhinitis, unspecified: Secondary | ICD-10-CM | POA: Diagnosis not present

## 2023-07-27 MED ORDER — AMOXICILLIN-POT CLAVULANATE 875-125 MG PO TABS: 1.0000 | ORAL_TABLET | Freq: Two times a day (BID) | ORAL | 0 refills | Status: AC

## 2023-07-27 NOTE — ED Provider Notes (Signed)
 [facility]    CSN: [id number] Arrival date & time: 07/27/23  1236      History   Chief Complaint Chief Complaint  Patient presents with   Cough    HPI Christy Hooper is a 23 y.o. female.   Patient presents today with 3-day history of congested cough, runny and stuffy nose, sore throat, sinus pressure and headache, bilateral ear fullness.  She denies fever, body aches or chills, shortness of breath or chest pain, abdominal pain, nausea/vomiting, and diarrhea.  No change in appetite or change in energy levels.  She reports history of "severe" allergies, has taken Claritin and Allegra for couple of days and stop taking them because it was not helping.    Past Medical History:  Diagnosis Date   Asthma    has not had an asthma attack or used inhaler since middle school    Patient Active Problem List   Diagnosis Date Noted   Ovarian cyst 05/04/2018   GBS bacteriuria 04/24/2018   Mild asthma 03/06/2018    Past Surgical History:  Procedure Laterality Date   OOPHORECTOMY Right    TONSILLECTOMY     WISDOM TOOTH EXTRACTION      OB History     Gravida  1   Para  0   Term  0   Preterm  0   AB  0   Living         SAB  0   IAB  0   Ectopic  0   Multiple      Live Births               Home Medications    Prior to Admission medications   Medication Sig Start Date End Date Taking? Authorizing Provider  amoxicillin-clavulanate (AUGMENTIN) 875-125 MG tablet Take 1 tablet by mouth 2 (two) times daily for 7 days. 07/30/23 08/06/23 Yes Valentino Nose, NP    Family History Family History  Problem Relation Age of Onset   Cancer Paternal Aunt    Depression Maternal Grandmother    Depression Maternal Grandfather    Cancer Other     Social History Social History   Tobacco Use   Smoking status: Some Days    Current packs/day: 0.00    Types: Cigarettes    Last attempt to quit: 01/23/2018    Years since quitting: 5.5   Smokeless  tobacco: Never   Tobacco comments:    "few cigarettes per week"  Vaping Use   Vaping status: Some Days  Substance Use Topics   Alcohol use: Yes   Drug use: Yes    Types: Marijuana    Comment: August 2019     Allergies   Patient has no known allergies.   Review of Systems Review of Systems Per HPI  Physical Exam Triage Vital Signs ED Triage Vitals [07/27/23 1346]  Encounter Vitals Group     BP 102/69     Systolic BP Percentile      Diastolic BP Percentile      Pulse Rate 94     Resp 16     Temp 98.5 F (36.9 C)     Temp Source Oral     SpO2 98 %     Weight 135 lb (61.2 kg)     Height 5\' 3"  (1.6 m)     Head Circumference      Peak Flow      Pain Score 4     Pain Loc  Pain Education      Exclude from Growth Chart    No data found.  Updated Vital Signs BP 102/69 (BP Location: Left Arm)   Pulse 94   Temp 98.5 F (36.9 C) (Oral)   Resp 16   Ht 5\' 3"  (1.6 m)   Wt 135 lb (61.2 kg)   LMP 06/22/2023 (Approximate)   SpO2 98%   BMI 23.91 kg/m   Visual Acuity Right Eye Distance:   Left Eye Distance:   Bilateral Distance:    Right Eye Near:   Left Eye Near:    Bilateral Near:     Physical Exam Vitals and nursing note reviewed.  Constitutional:      General: She is not in acute distress.    Appearance: Normal appearance. She is not ill-appearing or toxic-appearing.  HENT:     Head: Normocephalic and atraumatic.     Right Ear: Tympanic membrane, ear canal and external ear normal.     Left Ear: Tympanic membrane, ear canal and external ear normal.     Nose: Congestion and rhinorrhea present.     Right Sinus: Frontal sinus tenderness present. No maxillary sinus tenderness.     Left Sinus: Frontal sinus tenderness present. No maxillary sinus tenderness.     Mouth/Throat:     Mouth: Mucous membranes are moist.     Pharynx: Oropharynx is clear. Posterior oropharyngeal erythema present. No oropharyngeal exudate.  Eyes:     General: No scleral  icterus.    Extraocular Movements: Extraocular movements intact.  Cardiovascular:     Rate and Rhythm: Normal rate and regular rhythm.  Pulmonary:     Effort: Pulmonary effort is normal. No respiratory distress.     Breath sounds: Normal breath sounds. No wheezing, rhonchi or rales.  Musculoskeletal:     Cervical back: Normal range of motion and neck supple.  Lymphadenopathy:     Cervical: No cervical adenopathy.  Skin:    General: Skin is warm and dry.     Capillary Refill: Capillary refill takes less than 2 seconds.     Coloration: Skin is not jaundiced or pale.     Findings: No erythema or rash.  Neurological:     Mental Status: She is alert and oriented to person, place, and time.  Psychiatric:        Behavior: Behavior is cooperative.      UC Treatments / Results  Labs (all labs ordered are listed, but only abnormal results are displayed) Labs Reviewed - No data to display  EKG   Radiology No results found.  Procedures Procedures (including critical care time)  Medications Ordered in UC Medications - No data to display  Initial Impression / Assessment and Plan / UC Course  I have reviewed the triage vital signs and the nursing notes.  Pertinent labs & imaging results that were available during my care of the patient were reviewed by me and considered in my medical decision making (see chart for details).   Patient is well-appearing, normotensive, afebrile, not tachycardic, not tachypneic, oxygenating well on room air.    1. Allergic sinusitis Vitals and examination are reassuring Suspect allergic sinusitis at this point; recommended starting oral antihistamine daily along with Flonase nasal spray, saline nasal rinses and Mucinex Unclear if patient is taking all medication regularly as when I first asked her about the medication she had tried, she only mention Claritin and Allegra and then when I told her to start taking Flonase, Mucinex, and nasal saline,  she  said she had already tried that If no better after 2-3 more days, start Augmentin to cover for bacterial cause ER and return precautions discussed with patient   The patient was given the opportunity to ask questions.  All questions answered to their satisfaction.  The patient is in agreement to this plan.   Final Clinical Impressions(s) / UC Diagnoses   Final diagnoses:  Allergic sinusitis     Discharge Instructions      Please start taking Claritin, Flonase, Mucinex regularly, and using saline nasal rinses to help with allergy symptoms.  Symptoms should improve over the next couple of days.  If symptoms do not improve, you can pick up and start on the antibiotic on Sunday that was sent to your pharmacy.       ED Prescriptions     Medication Sig Dispense Auth. Provider   amoxicillin-clavulanate (AUGMENTIN) 875-125 MG tablet Take 1 tablet by mouth 2 (two) times daily for 7 days. 14 tablet Wilhemena Harbour, NP      PDMP not reviewed this encounter.   Wilhemena Harbour, NP 07/27/23 1505

## 2023-07-27 NOTE — Discharge Instructions (Signed)
 Please start taking Claritin, Flonase, Mucinex regularly, and using saline nasal rinses to help with allergy symptoms.  Symptoms should improve over the next couple of days.  If symptoms do not improve, you can pick up and start on the antibiotic on Sunday that was sent to your pharmacy.

## 2023-07-27 NOTE — ED Triage Notes (Signed)
 Patient here today with c/o cough, puffy eyes, nasal congestion, scratchy throat, and clogged ears X 3 days. She tried taking Zyrtec and Allegra with no relief.

## 2023-12-20 ENCOUNTER — Other Ambulatory Visit: Payer: Self-pay | Admitting: Medical Genetics

## 2024-01-17 ENCOUNTER — Encounter: Payer: Self-pay | Admitting: Certified Nurse Midwife

## 2024-01-17 ENCOUNTER — Other Ambulatory Visit (HOSPITAL_COMMUNITY)
Admission: RE | Admit: 2024-01-17 | Discharge: 2024-01-17 | Disposition: A | Discharge status: Home / Self Care | Source: Ambulatory Visit | Attending: Certified Nurse Midwife | Admitting: Certified Nurse Midwife

## 2024-01-17 ENCOUNTER — Ambulatory Visit: Admitting: Certified Nurse Midwife

## 2024-01-17 VITALS — BP 105/68 | HR 76 | Ht 62.0 in | Wt 145.0 lb

## 2024-01-17 DIAGNOSIS — Z3009 Encounter for other general counseling and advice on contraception: Secondary | ICD-10-CM

## 2024-01-17 DIAGNOSIS — Z01419 Encounter for gynecological examination (general) (routine) without abnormal findings: Secondary | ICD-10-CM

## 2024-01-17 DIAGNOSIS — Z124 Encounter for screening for malignant neoplasm of cervix: Secondary | ICD-10-CM | POA: Diagnosis present

## 2024-01-17 NOTE — Progress Notes (Signed)
 GYNECOLOGY OFFICE VISIT NOTE  History:   Christy Hooper is a 23 y.o. G1P0000 here today for a routine well woman visit. [facility], but has transitioned to [facility]. She is currently sexually active with both female and female partners. She reports using boric acid capsules after each sexual encounter. Currently using depo as contraception and desires to continue use. Denies self breast exams and denies having a regular menstrual period.  She denies any abnormal vaginal discharge, bleeding, pelvic pain or other concerns.     Past Medical History:  Diagnosis Date   Asthma    has not had an asthma attack or used inhaler since middle school    Past Surgical History:  Procedure Laterality Date   OOPHORECTOMY Right    TONSILLECTOMY     WISDOM TOOTH EXTRACTION      The following portions of the patient's history were reviewed and updated as appropriate: allergies, current medications, past family history, past medical history, past social history, past surgical history and problem list.   Health Maintenance:  Patient is unsure of last PAP but states that it was not abnormal.   Review of Systems:  Pertinent items noted in HPI and remainder of comprehensive ROS otherwise negative.  Physical Exam:  BP 105/68   Pulse 76   Ht 5' 2 (1.575 m)   Wt 145 lb (65.8 kg)   BMI 26.52 kg/m  CONSTITUTIONAL: Well-developed, well-nourished female in no acute distress.  HEENT:  Normocephalic, atraumatic. External right and left ear normal. No scleral icterus.  NECK: Normal range of motion, supple, no masses noted on observation SKIN: No rash noted. Not diaphoretic. No erythema. No pallor. MUSCULOSKELETAL: Normal range of motion. No edema noted. NEUROLOGIC: Alert and oriented to person, place, and time. Normal muscle tone coordination. No cranial nerve deficit noted. PSYCHIATRIC: Normal mood and affect. Normal behavior. Normal judgment and thought  content. CARDIOVASCULAR: Normal heart rate noted RESPIRATORY: Effort and breath sounds normal, no problems with respiration noted ABDOMEN: No masses noted. No other overt distention noted.   PELVIC: Normal appearing external genitalia; normal urethral meatus; normal appearing vaginal mucosa and cervix.  No abnormal discharge noted.  Normal uterine size, no other palpable masses, no uterine or adnexal tenderness. Performed in the presence of a chaperone  Labs and Imaging No results found for this or any previous visit (from the past week). No results found.    Assessment and Plan:    1. Encounter for annual routine gynecological examination (Primary) - Patient doing well.  - Reviewed to only use boric acid if having abnormal vaginal discharge, not as a daily use.  - Also reviewed good vaginal hygiene practices.  - Encouraged the use of SBE at home.   2. Birth control counseling - next depo after oct 24,  - plan to follow up 2 weeks for depo.   3. Pap smear for cervical cancer screening - STD testing completed at patient request.  - Pap collected.  - Cytology - PAP   Routine preventative health maintenance measures emphasized. Please refer to After Visit Summary for other counseling recommendations.   Return in about 4 weeks (around 02/14/2024) for DEPO.    I spent 30 minutes dedicated to the care of this patient including pre-visit review of records, face to face time with the patient discussing her conditions and treatments and post visit orders.   Liandro Thelin Erven) Emilio, MSN, CNM  [facility]  01/17/24 12:53 PM

## 2024-01-17 NOTE — Progress Notes (Signed)
 IUD was removed and now on depo- likes the depo   [facility]  today

## 2024-01-18 LAB — CYTOLOGY - PAP
Chlamydia: NEGATIVE
Comment: NEGATIVE
Comment: NEGATIVE
Comment: NORMAL
Diagnosis: NEGATIVE
Neisseria Gonorrhea: NEGATIVE
Trichomonas: NEGATIVE

## 2024-02-05 ENCOUNTER — Ambulatory Visit

## 2024-02-05 ENCOUNTER — Ambulatory Visit (INDEPENDENT_AMBULATORY_CARE_PROVIDER_SITE_OTHER): Admitting: *Deleted

## 2024-02-05 VITALS — BP 108/69 | HR 78

## 2024-02-05 DIAGNOSIS — Z3042 Encounter for surveillance of injectable contraceptive: Secondary | ICD-10-CM | POA: Diagnosis not present

## 2024-02-05 MED ORDER — MEDROXYPROGESTERONE ACETATE 150 MG/ML IM SUSY
150.0000 mg | PREFILLED_SYRINGE | Freq: Once | INTRAMUSCULAR | Status: AC
  Administered 2024-02-05: 150 mg via INTRAMUSCULAR

## 2024-02-05 MED ORDER — MEDROXYPROGESTERONE ACETATE 150 MG/ML IM SUSP: 150.0000 mg | INTRAMUSCULAR | 3 refills | Status: AC

## 2024-02-05 NOTE — Progress Notes (Signed)
 Date last pap: 01/17/24. Last Depo-Provera: Around august 8th at [facility] Side Effects if any: None. Serum HCG indicated? NA. Depo-Provera 150 mg IM given ab:Rympdupwz Audra, RN  Next appointment due Jan 12-26th.

## 2024-02-23 ENCOUNTER — Ambulatory Visit: Payer: Self-pay | Admitting: Family Medicine

## 2024-02-28 ENCOUNTER — Encounter: Admitting: Family

## 2024-02-28 NOTE — Progress Notes (Signed)
 Erroneous encounter-disregard

## 2024-03-22 ENCOUNTER — Ambulatory Visit: Admitting: Nurse Practitioner

## 2024-04-22 ENCOUNTER — Ambulatory Visit

## 2024-04-30 ENCOUNTER — Ambulatory Visit

## 2024-05-14 ENCOUNTER — Other Ambulatory Visit: Payer: Self-pay | Admitting: Medical Genetics

## 2024-05-14 DIAGNOSIS — Z006 Encounter for examination for normal comparison and control in clinical research program: Secondary | ICD-10-CM
